# Patient Record
Sex: Female | Born: 1955 | Race: White | Hispanic: No | Smoking: Never smoker
Health system: Southern US, Community
[De-identification: ages and names within clinical notes are randomized; demographics above are authoritative.]

## PROBLEM LIST (undated history)

## (undated) DIAGNOSIS — C50919 Malignant neoplasm of unspecified site of unspecified female breast: Secondary | ICD-10-CM

## (undated) HISTORY — DX: Malignant neoplasm of unspecified site of unspecified female breast: C50.919

## (undated) HISTORY — PX: MASTECTOMY: SHX3

---

## 1996-09-22 HISTORY — PX: BREAST SURGERY: SHX581

## 1998-09-22 HISTORY — PX: BREAST RECONSTRUCTION: SHX9

## 2002-11-16 ENCOUNTER — Encounter: Payer: Self-pay | Admitting: Hematology & Oncology

## 2002-11-16 ENCOUNTER — Encounter: Admission: RE | Admit: 2002-11-16 | Discharge: 2002-11-16 | Payer: Self-pay | Admitting: Hematology & Oncology

## 2003-12-18 ENCOUNTER — Encounter: Admission: RE | Admit: 2003-12-18 | Discharge: 2003-12-18 | Payer: Self-pay | Admitting: Hematology & Oncology

## 2004-02-27 ENCOUNTER — Other Ambulatory Visit: Admission: RE | Admit: 2004-02-27 | Discharge: 2004-02-27 | Payer: Self-pay | Admitting: Family Medicine

## 2004-05-21 ENCOUNTER — Ambulatory Visit (HOSPITAL_COMMUNITY): Admission: RE | Admit: 2004-05-21 | Discharge: 2004-05-21 | Payer: Self-pay | Admitting: Gastroenterology

## 2004-05-21 ENCOUNTER — Encounter (INDEPENDENT_AMBULATORY_CARE_PROVIDER_SITE_OTHER): Payer: Self-pay | Admitting: *Deleted

## 2004-12-12 ENCOUNTER — Ambulatory Visit: Payer: Self-pay | Admitting: Hematology & Oncology

## 2005-01-07 ENCOUNTER — Encounter: Admission: RE | Admit: 2005-01-07 | Discharge: 2005-01-07 | Payer: Self-pay | Admitting: Hematology & Oncology

## 2005-06-12 ENCOUNTER — Ambulatory Visit: Payer: Self-pay | Admitting: Hematology & Oncology

## 2005-12-12 ENCOUNTER — Ambulatory Visit: Payer: Self-pay | Admitting: Hematology & Oncology

## 2006-01-13 ENCOUNTER — Encounter: Admission: RE | Admit: 2006-01-13 | Discharge: 2006-01-13 | Payer: Self-pay | Admitting: Hematology & Oncology

## 2006-02-03 ENCOUNTER — Encounter: Admission: RE | Admit: 2006-02-03 | Discharge: 2006-02-03 | Payer: Self-pay | Admitting: Family Medicine

## 2006-05-06 ENCOUNTER — Other Ambulatory Visit: Admission: RE | Admit: 2006-05-06 | Discharge: 2006-05-06 | Payer: Self-pay | Admitting: Family Medicine

## 2006-06-11 ENCOUNTER — Ambulatory Visit: Payer: Self-pay | Admitting: Hematology & Oncology

## 2006-12-09 ENCOUNTER — Ambulatory Visit: Payer: Self-pay | Admitting: Hematology & Oncology

## 2006-12-14 LAB — COMPREHENSIVE METABOLIC PANEL
ALT: 19 U/L (ref 0–35)
Albumin: 4.6 g/dL (ref 3.5–5.2)
CO2: 28 mEq/L (ref 19–32)
Calcium: 9.7 mg/dL (ref 8.4–10.5)
Chloride: 102 mEq/L (ref 96–112)
Glucose, Bld: 94 mg/dL (ref 70–99)
Sodium: 140 mEq/L (ref 135–145)
Total Bilirubin: 0.5 mg/dL (ref 0.3–1.2)
Total Protein: 7.2 g/dL (ref 6.0–8.3)

## 2006-12-14 LAB — CBC WITH DIFFERENTIAL/PLATELET
BASO%: 0.3 % (ref 0.0–2.0)
Eosinophils Absolute: 0.2 10*3/uL (ref 0.0–0.5)
HCT: 40.3 % (ref 34.8–46.6)
LYMPH%: 12.1 % — ABNORMAL LOW (ref 14.0–48.0)
MCHC: 34.9 g/dL (ref 32.0–36.0)
MONO#: 0.4 10*3/uL (ref 0.1–0.9)
NEUT#: 3.1 10*3/uL (ref 1.5–6.5)
Platelets: 203 10*3/uL (ref 145–400)
RBC: 4.79 10*6/uL (ref 3.70–5.32)
WBC: 4.2 10*3/uL (ref 3.9–10.0)
lymph#: 0.5 10*3/uL — ABNORMAL LOW (ref 0.9–3.3)

## 2007-01-18 ENCOUNTER — Encounter: Admission: RE | Admit: 2007-01-18 | Discharge: 2007-01-18 | Payer: Self-pay | Admitting: Hematology & Oncology

## 2007-06-07 ENCOUNTER — Ambulatory Visit: Payer: Self-pay | Admitting: Hematology & Oncology

## 2007-06-09 ENCOUNTER — Other Ambulatory Visit: Admission: RE | Admit: 2007-06-09 | Discharge: 2007-06-09 | Payer: Self-pay | Admitting: Family Medicine

## 2007-06-09 LAB — CBC WITH DIFFERENTIAL/PLATELET
BASO%: 0.8 % (ref 0.0–2.0)
Eosinophils Absolute: 0.2 10*3/uL (ref 0.0–0.5)
LYMPH%: 25.2 % (ref 14.0–48.0)
MCHC: 35.3 g/dL (ref 32.0–36.0)
MONO#: 0.4 10*3/uL (ref 0.1–0.9)
NEUT#: 2.2 10*3/uL (ref 1.5–6.5)
Platelets: 227 10*3/uL (ref 145–400)
RBC: 4.85 10*6/uL (ref 3.70–5.32)
RDW: 12.6 % (ref 11.3–14.5)
WBC: 3.8 10*3/uL — ABNORMAL LOW (ref 3.9–10.0)
lymph#: 1 10*3/uL (ref 0.9–3.3)

## 2007-06-09 LAB — COMPREHENSIVE METABOLIC PANEL
ALT: 22 U/L (ref 0–35)
Albumin: 4.6 g/dL (ref 3.5–5.2)
CO2: 28 mEq/L (ref 19–32)
Chloride: 105 mEq/L (ref 96–112)
Glucose, Bld: 62 mg/dL — ABNORMAL LOW (ref 70–99)
Potassium: 4.1 mEq/L (ref 3.5–5.3)
Sodium: 142 mEq/L (ref 135–145)
Total Bilirubin: 0.5 mg/dL (ref 0.3–1.2)
Total Protein: 7.2 g/dL (ref 6.0–8.3)

## 2007-12-08 ENCOUNTER — Ambulatory Visit: Payer: Self-pay | Admitting: Hematology & Oncology

## 2007-12-10 LAB — COMPREHENSIVE METABOLIC PANEL
CO2: 28 mEq/L (ref 19–32)
Calcium: 9.8 mg/dL (ref 8.4–10.5)
Chloride: 101 mEq/L (ref 96–112)
Creatinine, Ser: 0.94 mg/dL (ref 0.40–1.20)
Glucose, Bld: 162 mg/dL — ABNORMAL HIGH (ref 70–99)
Total Bilirubin: 0.4 mg/dL (ref 0.3–1.2)

## 2007-12-10 LAB — CBC WITH DIFFERENTIAL/PLATELET
Basophils Absolute: 0.1 10*3/uL (ref 0.0–0.1)
Eosinophils Absolute: 0.1 10*3/uL (ref 0.0–0.5)
HCT: 41.5 % (ref 34.8–46.6)
HGB: 14.3 g/dL (ref 11.6–15.9)
LYMPH%: 13 % — ABNORMAL LOW (ref 14.0–48.0)
MONO#: 0.3 10*3/uL (ref 0.1–0.9)
NEUT#: 5.4 10*3/uL (ref 1.5–6.5)
NEUT%: 79.8 % — ABNORMAL HIGH (ref 39.6–76.8)
Platelets: 276 10*3/uL (ref 145–400)
WBC: 6.8 10*3/uL (ref 3.9–10.0)
lymph#: 0.9 10*3/uL (ref 0.9–3.3)

## 2007-12-31 IMAGING — CR DG KNEE 3 VIEWS*R*
3 series · 3 of 3 positions shown · non-contrast
Comparison: none

CLINICAL DATA: Pain in the knee.  No trauma. 
 RIGHT KNEE THREE VIEWS:
 Three views of the right knee were obtained.  Joint spaces are normal and no effusion is seen.  No bony abnormality is noted.

[view not recorded (1 of 3)]
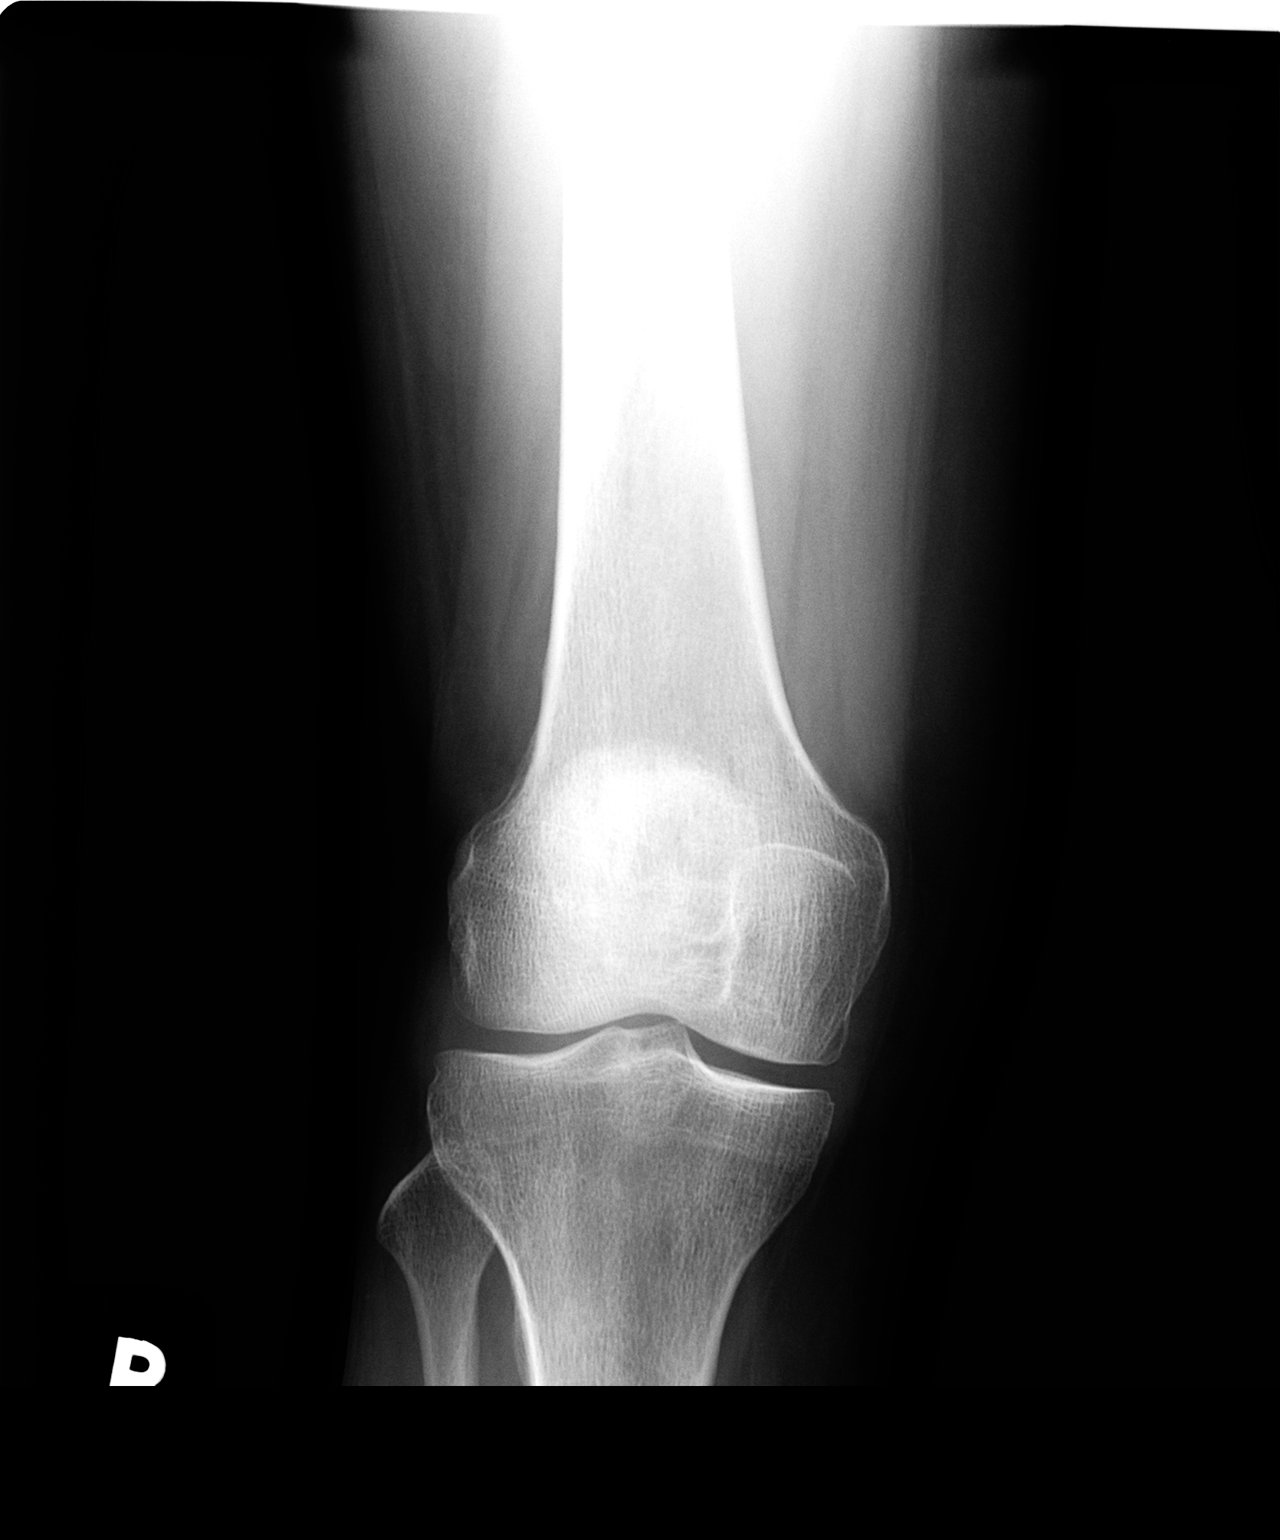

[view not recorded (2 of 3)]
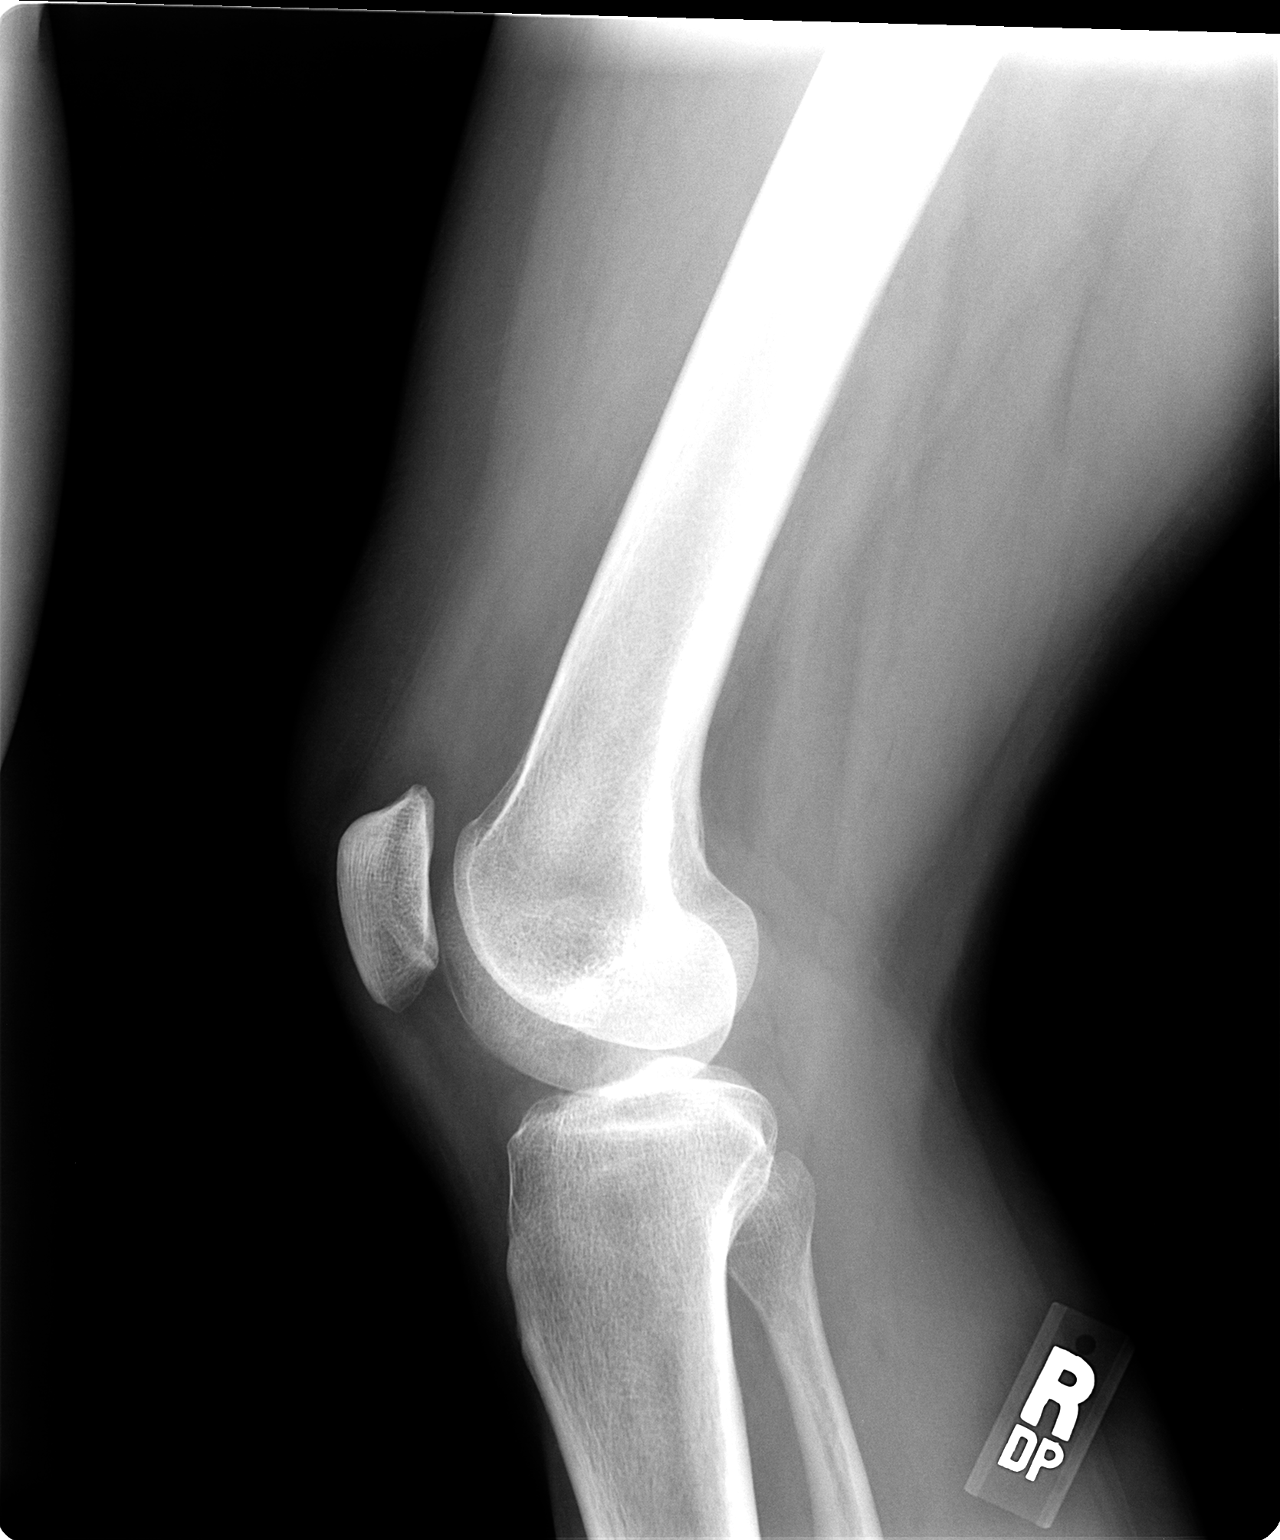

[view not recorded (3 of 3)]
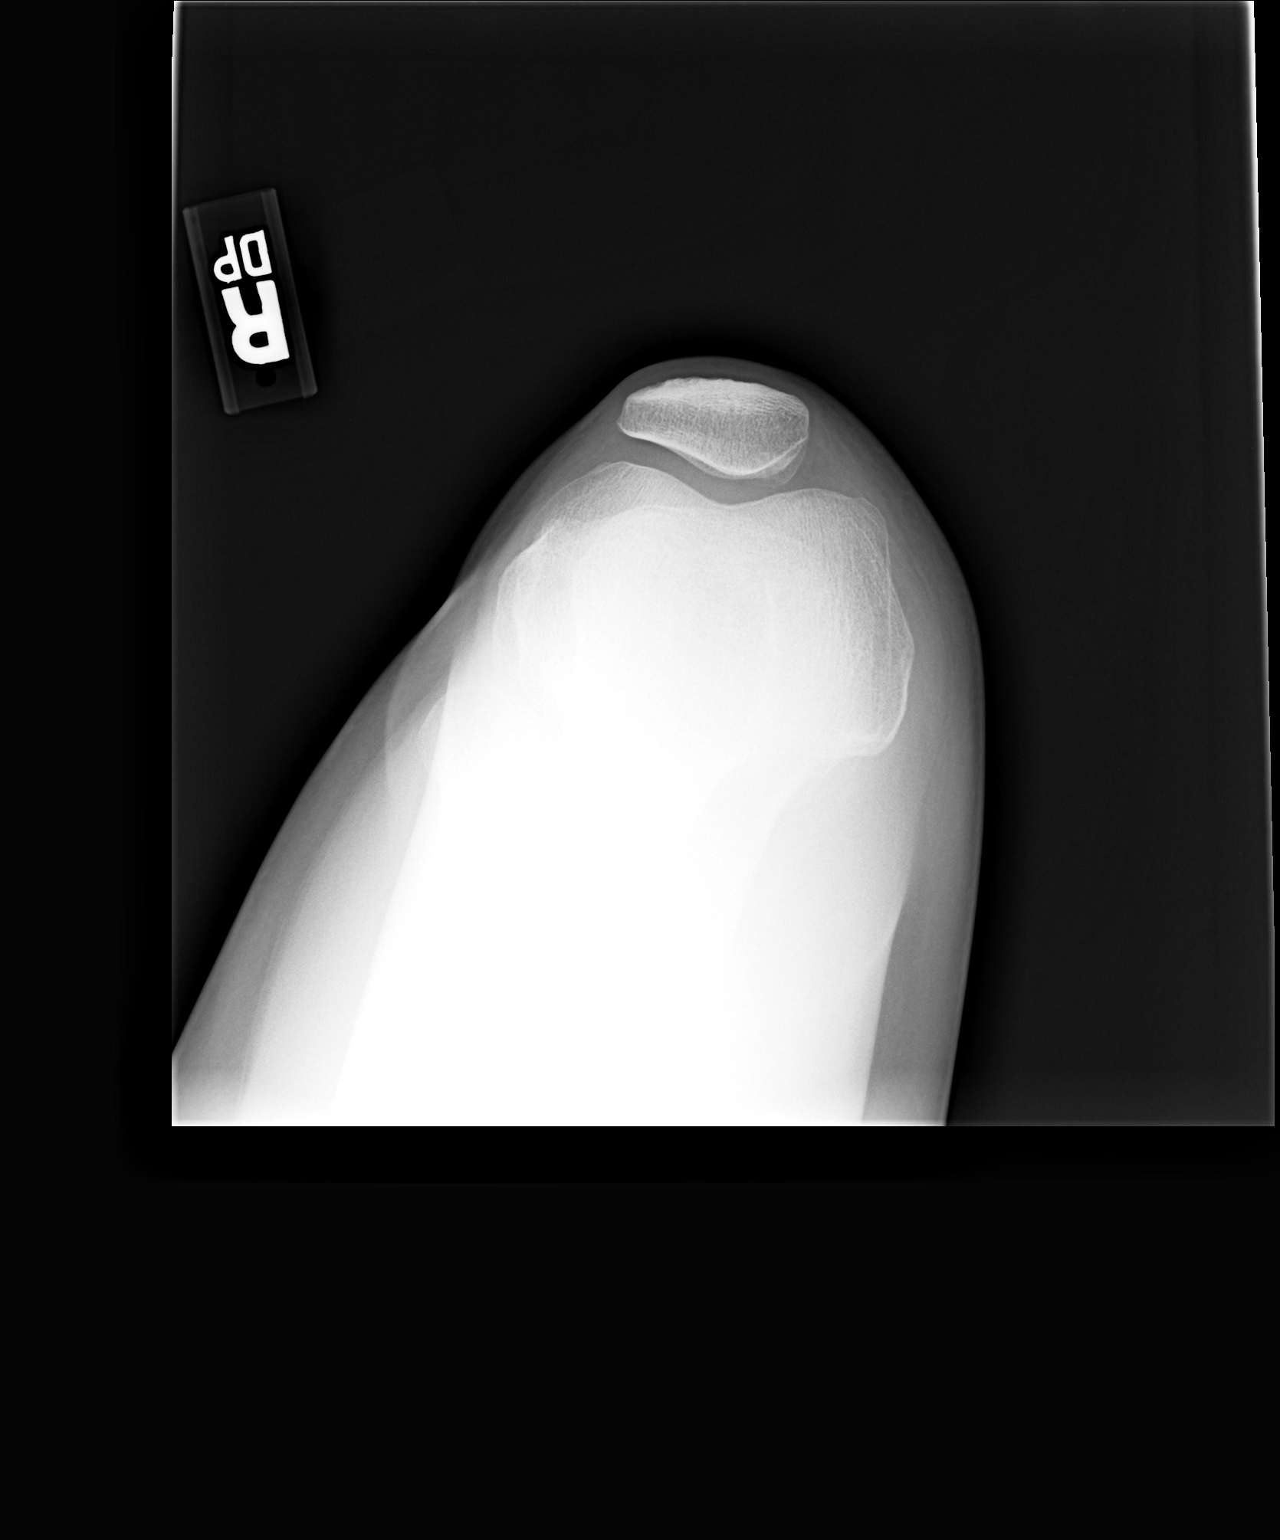

[3 of 3 positions shown; findings below may reference images not displayed]

IMPRESSION: Negative right knee.

## 2008-01-19 ENCOUNTER — Encounter: Admission: RE | Admit: 2008-01-19 | Discharge: 2008-01-19 | Payer: Self-pay | Admitting: Family Medicine

## 2008-01-24 ENCOUNTER — Encounter: Admission: RE | Admit: 2008-01-24 | Discharge: 2008-01-24 | Payer: Self-pay | Admitting: Family Medicine

## 2008-06-08 ENCOUNTER — Ambulatory Visit: Payer: Self-pay | Admitting: Hematology & Oncology

## 2008-06-09 LAB — CBC WITH DIFFERENTIAL (CANCER CENTER ONLY)
BASO#: 0 10*3/uL (ref 0.0–0.2)
BASO%: 0.7 % (ref 0.0–2.0)
Eosinophils Absolute: 0.2 10*3/uL (ref 0.0–0.5)
HCT: 44.3 % (ref 34.8–46.6)
HGB: 15.2 g/dL (ref 11.6–15.9)
LYMPH#: 1.2 10*3/uL (ref 0.9–3.3)
MONO#: 0.3 10*3/uL (ref 0.1–0.9)
NEUT%: 57 % (ref 39.6–80.0)
RBC: 5.3 10*6/uL (ref 3.70–5.32)
WBC: 4 10*3/uL (ref 3.9–10.0)

## 2008-06-09 LAB — COMPREHENSIVE METABOLIC PANEL
ALT: 17 U/L (ref 0–35)
BUN: 14 mg/dL (ref 6–23)
CO2: 27 mEq/L (ref 19–32)
Creatinine, Ser: 0.89 mg/dL (ref 0.40–1.20)
Glucose, Bld: 97 mg/dL (ref 70–99)
Total Bilirubin: 0.5 mg/dL (ref 0.3–1.2)

## 2009-01-04 ENCOUNTER — Ambulatory Visit: Payer: Self-pay | Admitting: Hematology & Oncology

## 2009-01-05 LAB — CBC WITH DIFFERENTIAL (CANCER CENTER ONLY)
BASO%: 0.5 % (ref 0.0–2.0)
Eosinophils Absolute: 0.2 10*3/uL (ref 0.0–0.5)
LYMPH#: 1 10*3/uL (ref 0.9–3.3)
MONO#: 0.2 10*3/uL (ref 0.1–0.9)
Platelets: 217 10*3/uL (ref 145–400)
RBC: 4.57 10*6/uL (ref 3.70–5.32)
RDW: 11.6 % (ref 10.5–14.6)
WBC: 3.6 10*3/uL — ABNORMAL LOW (ref 3.9–10.0)

## 2009-01-08 LAB — COMPREHENSIVE METABOLIC PANEL
ALT: 20 U/L (ref 0–35)
Albumin: 4.6 g/dL (ref 3.5–5.2)
Alkaline Phosphatase: 51 U/L (ref 39–117)
Glucose, Bld: 83 mg/dL (ref 70–99)
Potassium: 4.2 mEq/L (ref 3.5–5.3)
Sodium: 139 mEq/L (ref 135–145)
Total Protein: 7.3 g/dL (ref 6.0–8.3)

## 2009-01-08 LAB — VITAMIN D 25 HYDROXY (VIT D DEFICIENCY, FRACTURES): Vit D, 25-Hydroxy: 33 ng/mL (ref 30–89)

## 2009-01-24 ENCOUNTER — Encounter: Admission: RE | Admit: 2009-01-24 | Discharge: 2009-01-24 | Payer: Self-pay | Admitting: Hematology & Oncology

## 2009-05-22 ENCOUNTER — Other Ambulatory Visit: Admission: RE | Admit: 2009-05-22 | Discharge: 2009-05-22 | Payer: Self-pay | Admitting: Family Medicine

## 2009-07-11 ENCOUNTER — Ambulatory Visit: Payer: Self-pay | Admitting: Hematology & Oncology

## 2009-07-13 LAB — CBC WITH DIFFERENTIAL (CANCER CENTER ONLY)
BASO%: 0.6 % (ref 0.0–2.0)
LYMPH%: 29.1 % (ref 14.0–48.0)
MCV: 87 fL (ref 81–101)
MONO#: 0.3 10*3/uL (ref 0.1–0.9)
MONO%: 7.2 % (ref 0.0–13.0)
NEUT#: 2 10*3/uL (ref 1.5–6.5)
Platelets: 222 10*3/uL (ref 145–400)
RBC: 4.92 10*6/uL (ref 3.70–5.32)
RDW: 12.2 % (ref 10.5–14.6)
WBC: 3.6 10*3/uL — ABNORMAL LOW (ref 3.9–10.0)

## 2009-07-14 LAB — COMPREHENSIVE METABOLIC PANEL
Albumin: 4.8 g/dL (ref 3.5–5.2)
Alkaline Phosphatase: 47 U/L (ref 39–117)
BUN: 12 mg/dL (ref 6–23)
Glucose, Bld: 75 mg/dL (ref 70–99)
Potassium: 4.4 mEq/L (ref 3.5–5.3)
Total Bilirubin: 0.5 mg/dL (ref 0.3–1.2)

## 2010-01-22 ENCOUNTER — Encounter: Admission: RE | Admit: 2010-01-22 | Discharge: 2010-01-22 | Payer: Self-pay | Admitting: Hematology & Oncology

## 2010-07-11 ENCOUNTER — Ambulatory Visit: Payer: Self-pay | Admitting: Hematology & Oncology

## 2010-07-12 LAB — CBC WITH DIFFERENTIAL (CANCER CENTER ONLY)
BASO#: 0 10*3/uL (ref 0.0–0.2)
EOS%: 5 % (ref 0.0–7.0)
HCT: 41.4 % (ref 34.8–46.6)
HGB: 14 g/dL (ref 11.6–15.9)
MCH: 28.8 pg (ref 26.0–34.0)
MCHC: 33.7 g/dL (ref 32.0–36.0)
MONO%: 9.7 % (ref 0.0–13.0)
NEUT%: 60.3 % (ref 39.6–80.0)

## 2010-10-13 ENCOUNTER — Encounter: Payer: Self-pay | Admitting: Family Medicine

## 2011-01-20 ENCOUNTER — Other Ambulatory Visit: Payer: Self-pay | Admitting: Hematology & Oncology

## 2011-01-20 DIAGNOSIS — Z9010 Acquired absence of unspecified breast and nipple: Secondary | ICD-10-CM

## 2011-01-27 ENCOUNTER — Other Ambulatory Visit: Payer: Self-pay | Admitting: Dermatology

## 2011-02-03 ENCOUNTER — Ambulatory Visit
Admission: RE | Admit: 2011-02-03 | Discharge: 2011-02-03 | Disposition: A | Discharge status: Home / Self Care | Payer: BC Managed Care – PPO | Source: Ambulatory Visit | Attending: Hematology & Oncology | Admitting: Hematology & Oncology

## 2011-02-03 DIAGNOSIS — Z9010 Acquired absence of unspecified breast and nipple: Secondary | ICD-10-CM

## 2011-03-11 ENCOUNTER — Other Ambulatory Visit: Payer: Self-pay | Admitting: Family Medicine

## 2011-03-11 ENCOUNTER — Other Ambulatory Visit (HOSPITAL_COMMUNITY)
Admission: RE | Admit: 2011-03-11 | Discharge: 2011-03-11 | Disposition: A | Discharge status: Home / Self Care | Payer: BC Managed Care – PPO | Source: Ambulatory Visit | Attending: Family Medicine | Admitting: Family Medicine

## 2011-03-11 DIAGNOSIS — Z124 Encounter for screening for malignant neoplasm of cervix: Secondary | ICD-10-CM | POA: Insufficient documentation

## 2011-07-04 ENCOUNTER — Other Ambulatory Visit: Payer: Self-pay | Admitting: Hematology & Oncology

## 2011-07-04 ENCOUNTER — Encounter (HOSPITAL_BASED_OUTPATIENT_CLINIC_OR_DEPARTMENT_OTHER): Payer: BC Managed Care – PPO | Admitting: Hematology & Oncology

## 2011-07-04 DIAGNOSIS — C50419 Malignant neoplasm of upper-outer quadrant of unspecified female breast: Secondary | ICD-10-CM

## 2011-07-04 DIAGNOSIS — M949 Disorder of cartilage, unspecified: Secondary | ICD-10-CM

## 2011-07-04 DIAGNOSIS — Z853 Personal history of malignant neoplasm of breast: Secondary | ICD-10-CM

## 2011-07-04 LAB — CBC WITH DIFFERENTIAL (CANCER CENTER ONLY)
BASO#: 0 10*3/uL (ref 0.0–0.2)
EOS%: 5.5 % (ref 0.0–7.0)
HCT: 42.4 % (ref 34.8–46.6)
HGB: 14.5 g/dL (ref 11.6–15.9)
LYMPH#: 1.1 10*3/uL (ref 0.9–3.3)
MCH: 29.4 pg (ref 26.0–34.0)
MCHC: 34.2 g/dL (ref 32.0–36.0)
MCV: 86 fL (ref 81–101)
NEUT%: 56.7 % (ref 39.6–80.0)

## 2011-07-05 LAB — COMPREHENSIVE METABOLIC PANEL
ALT: 31 U/L (ref 0–35)
CO2: 27 mEq/L (ref 19–32)
Creatinine, Ser: 0.82 mg/dL (ref 0.50–1.10)
Total Bilirubin: 0.4 mg/dL (ref 0.3–1.2)

## 2011-07-05 LAB — VITAMIN D 25 HYDROXY (VIT D DEFICIENCY, FRACTURES): Vit D, 25-Hydroxy: 43 ng/mL (ref 30–89)

## 2011-08-04 ENCOUNTER — Encounter: Payer: Self-pay | Admitting: *Deleted

## 2011-08-13 ENCOUNTER — Encounter: Payer: Self-pay | Admitting: Family

## 2011-08-13 ENCOUNTER — Ambulatory Visit (HOSPITAL_BASED_OUTPATIENT_CLINIC_OR_DEPARTMENT_OTHER): Payer: BC Managed Care – PPO | Admitting: Family

## 2011-08-13 VITALS — BP 100/67 | HR 85 | Temp 97.2°F | Ht 61.5 in | Wt 131.0 lb

## 2011-08-13 DIAGNOSIS — C50919 Malignant neoplasm of unspecified site of unspecified female breast: Secondary | ICD-10-CM

## 2011-08-13 DIAGNOSIS — Z31438 Encounter for other genetic testing of female for procreative management: Secondary | ICD-10-CM

## 2011-08-13 DIAGNOSIS — Z853 Personal history of malignant neoplasm of breast: Secondary | ICD-10-CM

## 2011-08-13 HISTORY — DX: Malignant neoplasm of unspecified site of unspecified female breast: C50.919

## 2011-08-13 NOTE — Progress Notes (Signed)
DIAGNOSIS: Patient Active Problem List  Diagnoses Date Noted  . Breast cancer, female 08/13/2011     Encounter Diagnosis  Name Primary?  . Breast cancer, female     CURRENT THERAPY: Surveillance only.   INTERIM HISTORY: Pt expressed interest in genetic testing at last visit with Dr. Myna Hidalgo one month ago. She is here for discussion regarding BRCA testing.   PHYSICAL EXAM: BP 100/67  Pulse 85  Temp(Src) 97.2 F (36.2 C) (Oral)  Ht 5' 1.5" (1.562 m)  Wt 131 lb 0.1 oz (59.423 kg)  BMI 24.35 kg/m2 General: Well developed, well nourished, in no acute distress.  Psych: Alert and oriented X 3, appropriate mood and affect.     LABORATORY STUDIES:   Results for orders placed in visit on 07/04/11  COMPREHENSIVE METABOLIC PANEL      Component Value Range   Sodium 143  135 - 145 (mEq/L)   Potassium 4.9  3.5 - 5.3 (mEq/L)   Chloride 103  96 - 112 (mEq/L)   CO2 27  19 - 32 (mEq/L)   Glucose, Bld 79  70 - 99 (mg/dL)   BUN 16  6 - 23 (mg/dL)   Creatinine, Ser 1.61  0.50 - 1.10 (mg/dL)   Total Bilirubin 0.4  0.3 - 1.2 (mg/dL)   Alkaline Phosphatase 73  39 - 117 (U/L)   AST 30  0 - 37 (U/L)   ALT 31  0 - 35 (U/L)   Total Protein 7.2  6.0 - 8.3 (g/dL)   Albumin 4.8  3.5 - 5.2 (g/dL)   Calcium 09.6  8.4 - 10.5 (mg/dL)  COMPREHENSIVE METABOLIC PANEL      Component Value Range   Sodium 143  135 - 145 (mEq/L)   Potassium 4.9  3.5 - 5.3 (mEq/L)   Chloride 103  96 - 112 (mEq/L)   CO2 27  19 - 32 (mEq/L)   Glucose, Bld 79  70 - 99 (mg/dL)   BUN 16  6 - 23 (mg/dL)   Creatinine, Ser 0.45  0.50 - 1.10 (mg/dL)   Total Bilirubin 0.4  0.3 - 1.2 (mg/dL)   Alkaline Phosphatase 73  39 - 117 (U/L)   AST 30  0 - 37 (U/L)   ALT 31  0 - 35 (U/L)   Total Protein 7.2  6.0 - 8.3 (g/dL)   Albumin 4.8  3.5 - 5.2 (g/dL)   Calcium 40.9  8.4 - 10.5 (mg/dL)  VITAMIN D 25 HYDROXY      Component Value Range   Vit D, 25-Hydroxy 43  30 - 89 (ng/mL)    IMPRESSION:  55 y/o female with history of breast  cancer diagnosed at age 46. Following NCCN guidelines, a personal history of breast cancer diagnosed before age 86, there is an indication for testing.   PLAN:  Return to clinic Monday for testing.   DISCUSSION: The implications of both positive and negative result are discussed with her. These implications are for her, and for her daughters, who could be potential carriers, if she is positive. If she proves to be positive, I will refer her for genetic counseling for broader discussion regarding management.

## 2011-08-26 ENCOUNTER — Telehealth: Payer: Self-pay | Admitting: Family

## 2011-08-27 NOTE — Telephone Encounter (Signed)
Gave results of genetic testing (no mutation detected.)

## 2011-09-01 ENCOUNTER — Telehealth: Payer: Self-pay | Admitting: *Deleted

## 2011-09-01 NOTE — Telephone Encounter (Signed)
Per NP mailed results to patient

## 2011-09-04 ENCOUNTER — Other Ambulatory Visit: Payer: Self-pay | Admitting: Family

## 2012-01-13 ENCOUNTER — Other Ambulatory Visit: Payer: Self-pay | Admitting: Hematology & Oncology

## 2012-01-13 DIAGNOSIS — Z1231 Encounter for screening mammogram for malignant neoplasm of breast: Secondary | ICD-10-CM

## 2012-02-06 ENCOUNTER — Ambulatory Visit
Admission: RE | Admit: 2012-02-06 | Discharge: 2012-02-06 | Disposition: A | Discharge status: Home / Self Care | Payer: BC Managed Care – PPO | Source: Ambulatory Visit | Attending: Hematology & Oncology | Admitting: Hematology & Oncology

## 2012-02-06 DIAGNOSIS — Z1231 Encounter for screening mammogram for malignant neoplasm of breast: Secondary | ICD-10-CM

## 2012-07-02 ENCOUNTER — Ambulatory Visit (HOSPITAL_BASED_OUTPATIENT_CLINIC_OR_DEPARTMENT_OTHER): Payer: BC Managed Care – PPO | Admitting: Hematology & Oncology

## 2012-07-02 ENCOUNTER — Other Ambulatory Visit (HOSPITAL_BASED_OUTPATIENT_CLINIC_OR_DEPARTMENT_OTHER): Payer: BC Managed Care – PPO | Admitting: Lab

## 2012-07-02 VITALS — BP 110/63 | HR 84 | Temp 98.8°F | Resp 16 | Ht 61.0 in | Wt 129.0 lb

## 2012-07-02 DIAGNOSIS — Z853 Personal history of malignant neoplasm of breast: Secondary | ICD-10-CM

## 2012-07-02 DIAGNOSIS — M810 Age-related osteoporosis without current pathological fracture: Secondary | ICD-10-CM

## 2012-07-02 DIAGNOSIS — C50919 Malignant neoplasm of unspecified site of unspecified female breast: Secondary | ICD-10-CM

## 2012-07-02 DIAGNOSIS — C50419 Malignant neoplasm of upper-outer quadrant of unspecified female breast: Secondary | ICD-10-CM

## 2012-07-02 LAB — CBC WITH DIFFERENTIAL (CANCER CENTER ONLY)
BASO%: 0.4 % (ref 0.0–2.0)
HCT: 43.6 % (ref 34.8–46.6)
HGB: 14.8 g/dL (ref 11.6–15.9)
LYMPH#: 1.1 10*3/uL (ref 0.9–3.3)
MONO#: 0.4 10*3/uL (ref 0.1–0.9)
NEUT#: 2.9 10*3/uL (ref 1.5–6.5)
NEUT%: 62.5 % (ref 39.6–80.0)
RDW: 12.7 % (ref 11.1–15.7)
WBC: 4.6 10*3/uL (ref 3.9–10.0)

## 2012-07-02 NOTE — Progress Notes (Signed)
This office note has been dictated.

## 2012-07-02 NOTE — Progress Notes (Signed)
CC:   Debbie Vargas. Debbie Vargas, M.D.  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  CURRENT THERAPY:  Observation.  INTERIM HISTORY:  Patient comes in for followup.  We see her yearly. She did have BRCA testing done last year.  This was done in November. She was negative for both BRCA1 and BRCA2 mutations.  Her Femara was stopped back in 2010.  She was on it for a good 11 years. She is feeling well.  She is still teaching.  This is becoming more of a strain for her.  She wants to retire in 2016.  Her youngest daughter got married over the summer.  This was a good event for her.  She has had no bony pain.  She is not taking vitamin D.  She is not taking aspirin.  I told him to start taking both.  She will do both.  There has been no change in bowel or bladder habits now.  Her last mammogram was done back in May.  This was of the right breast. This was negative for any obvious malignancy or suspicious skin microcalcifications.  PHYSICAL EXAMINATION:  This is a petite white female in no obvious distress.  Vital signs:  98.3, pulse 84, respiratory rate 16, blood pressure 110/63.  Weight is 129.  Head/neck:  Normocephalic, atraumatic skull.  There are no ocular or oral lesions.  There are no palpable cervical or supraclavicular lymph nodes.  Lungs:  Clear bilaterally. Cardiac:  Regular rate and rhythm with a normal S1 and S2.  There are no murmurs, rubs or bruits.  Abdomen:  Soft with good bowel sounds.  There is no palpable abdominal mass.  There is no fluid wave.  There is no palpable hepatosplenomegaly.  Breasts:  Right breast with no masses, edema or erythema.  There is no right axillary adenopathy.  Left chest wall shows a well-healed mastectomy.  In the left chest wall, no nodules are noted.  She has a reconstruction that is well healed.  There is no left axillary adenopathy.  Back:  No tenderness of the spine, ribs, or hips.  Extremities:  No  clubbing, cyanosis or edema.  LABORATORY STUDIES:  White blood cell count 4.6, hemoglobin 14.8, hematocrit 43.6, platelet count 245.  IMPRESSION:  Patient is a 56 year old white female with a history of locally recurrent ductal carcinoma of the left breast.  She underwent mastectomy.  She was diagnosed back in 1999.  She is on Femara.  Again, she is BRCA negative.  We will plan to get her back in another year.  I do not see that we need to do any lab work or x-rays in between visits.  Again, I encouraged her to take the aspirin and the vitamin D.    ______________________________ Josph Macho, M.D. PRE/MEDQ  D:  07/02/2012  T:  07/02/2012  Job:  [id number]

## 2012-07-03 LAB — COMPREHENSIVE METABOLIC PANEL
ALT: 25 U/L (ref 0–35)
AST: 28 U/L (ref 0–37)
Albumin: 4.7 g/dL (ref 3.5–5.2)
BUN: 11 mg/dL (ref 6–23)
Calcium: 10.8 mg/dL — ABNORMAL HIGH (ref 8.4–10.5)
Chloride: 100 mEq/L (ref 96–112)
Potassium: 4.9 mEq/L (ref 3.5–5.3)
Sodium: 139 mEq/L (ref 135–145)
Total Protein: 7.3 g/dL (ref 6.0–8.3)

## 2012-07-07 ENCOUNTER — Telehealth: Payer: Self-pay | Admitting: *Deleted

## 2012-07-07 NOTE — Telephone Encounter (Signed)
Called patient to let her know that her labwork looks fantastic per dr. Myna Hidalgo.

## 2012-07-07 NOTE — Telephone Encounter (Signed)
Message copied by Anselm Jungling on Wed Jul 07, 2012 10:23 AM ------      Message from: Josph Macho      Created: Tue Jul 06, 2012  2:00 PM       Please call and tell her that her lab work looks fantastic. Thank you. Cindee Lame

## 2013-01-11 ENCOUNTER — Other Ambulatory Visit: Payer: Self-pay

## 2013-01-11 DIAGNOSIS — Z9012 Acquired absence of left breast and nipple: Secondary | ICD-10-CM

## 2013-01-11 DIAGNOSIS — Z1231 Encounter for screening mammogram for malignant neoplasm of breast: Secondary | ICD-10-CM

## 2013-02-10 ENCOUNTER — Ambulatory Visit
Admission: RE | Admit: 2013-02-10 | Discharge: 2013-02-10 | Disposition: A | Discharge status: Home / Self Care | Payer: BC Managed Care – PPO | Source: Ambulatory Visit

## 2013-02-10 DIAGNOSIS — Z1231 Encounter for screening mammogram for malignant neoplasm of breast: Secondary | ICD-10-CM

## 2013-02-10 DIAGNOSIS — Z9012 Acquired absence of left breast and nipple: Secondary | ICD-10-CM

## 2013-07-01 ENCOUNTER — Other Ambulatory Visit (HOSPITAL_BASED_OUTPATIENT_CLINIC_OR_DEPARTMENT_OTHER): Payer: BC Managed Care – PPO | Admitting: Lab

## 2013-07-01 ENCOUNTER — Ambulatory Visit (HOSPITAL_BASED_OUTPATIENT_CLINIC_OR_DEPARTMENT_OTHER): Payer: BC Managed Care – PPO | Admitting: Hematology & Oncology

## 2013-07-01 VITALS — BP 116/54 | HR 73 | Temp 98.1°F | Resp 14 | Ht 61.0 in | Wt 131.0 lb

## 2013-07-01 DIAGNOSIS — M810 Age-related osteoporosis without current pathological fracture: Secondary | ICD-10-CM

## 2013-07-01 DIAGNOSIS — C50919 Malignant neoplasm of unspecified site of unspecified female breast: Secondary | ICD-10-CM

## 2013-07-01 DIAGNOSIS — Z853 Personal history of malignant neoplasm of breast: Secondary | ICD-10-CM

## 2013-07-01 DIAGNOSIS — C50912 Malignant neoplasm of unspecified site of left female breast: Secondary | ICD-10-CM

## 2013-07-01 LAB — CBC WITH DIFFERENTIAL (CANCER CENTER ONLY)
BASO%: 0.7 % (ref 0.0–2.0)
HCT: 44 % (ref 34.8–46.6)
LYMPH%: 27.8 % (ref 14.0–48.0)
MCH: 28.6 pg (ref 26.0–34.0)
MCHC: 32.7 g/dL (ref 32.0–36.0)
MCV: 88 fL (ref 81–101)
MONO%: 9.6 % (ref 0.0–13.0)
NEUT%: 57.5 % (ref 39.6–80.0)
Platelets: 226 10*3/uL (ref 145–400)
RDW: 12.5 % (ref 11.1–15.7)
WBC: 4.1 10*3/uL (ref 3.9–10.0)

## 2013-07-01 NOTE — Progress Notes (Signed)
This office note has been dictated.

## 2013-07-02 LAB — VITAMIN D 25 HYDROXY (VIT D DEFICIENCY, FRACTURES): Vit D, 25-Hydroxy: 57 ng/mL (ref 30–89)

## 2013-07-02 LAB — COMPREHENSIVE METABOLIC PANEL
ALT: 22 U/L (ref 0–35)
Alkaline Phosphatase: 62 U/L (ref 39–117)
Creatinine, Ser: 0.8 mg/dL (ref 0.50–1.10)
Sodium: 138 mEq/L (ref 135–145)
Total Bilirubin: 0.5 mg/dL (ref 0.3–1.2)
Total Protein: 6.9 g/dL (ref 6.0–8.3)

## 2013-07-02 NOTE — Progress Notes (Signed)
CC:   Gretta Arab. Valentina Lucks, M.D.  DIAGNOSIS:  History of locally-recurrent ductal carcinoma of the left breast.  CURRENT THERAPY:  Observation.  INTERIM HISTORY:  Ms. Debbie Vargas comes in for her yearly followup.  She is doing really well.  She was will retire in a couple of years.  She is going to [place] in 1 month.  She will be on a cruise with one of her friends.  She is looking forward to this.  She has had no problems with nausea or vomiting.  There has been no bony pain.  She has had no change in bowel or bladder habits.  Hot flashes have not been a problem for her.  She continues to take her baby aspirin.  She also is taking her vitamin D.  Her last mammogram was done back in May of this year.  This was of the right breast.  Everything looked fine without any suspicious calcifications.  PHYSICAL EXAMINATION:  General:  This is a well-developed, well- nourished white female in no obvious distress.  Vital signs: Temperature of 98.1, pulse 73, respiratory rate 14, blood pressure 116/54.  Weight is 131 pounds.  Head and neck:  Normocephalic, atraumatic skull.  There are no ocular or oral lesions.  There are no palpable cervical or supraclavicular lymph nodes.  Lungs:  Clear bilaterally.  Cardiac:  Regular rate and rhythm with a normal S1 and S2. There are no murmurs, rubs or bruits.  Abdomen:  Soft.  She has good bowel sounds.  There is no fluid wave.  There is no palpable abdominal mass.  No palpable hepatosplenomegaly.  Breasts:  Right breast with no masses, edema or erythema.  There is no right axillary adenopathy.  Left breast shows a mastectomy.  She has an implant.  This is intact.  There is no erythema or nodularity about the implant.  There is no left axillary adenopathy.  Back:  No tenderness over the spine, ribs, or hips.  Extremities:  No clubbing, cyanosis or edema.  Neurologic:  No focal neurological deficits.  LABORATORY STUDIES:  White cell count is 4.1, hemoglobin  14.4, hematocrit 44, platelet count 226.  IMPRESSION:  Debbie Vargas is a very charming 57 year old white female with history of locally-recurrent ductal carcinoma of the left breast.  She had this back in 1999.  She was placed on Femara.  She had a mastectomy.  Of note, she is BRCA negative.  For now, we will plan to get her back in 1 more year.  I do not see a need for any lab work or x-rays in between visits.    ______________________________ Josph Macho, M.D. PRE/MEDQ  D:  07/01/2013  T:  07/02/2013  Job:  [id number]

## 2013-07-04 ENCOUNTER — Telehealth: Payer: Self-pay | Admitting: *Deleted

## 2013-07-04 NOTE — Telephone Encounter (Addendum)
Message copied by Mirian Capuchin on Mon Jul 04, 2013 12:51 PM ------      Message from: Josph Macho      Created: Mon Jul 04, 2013 11:31 AM       Call - labs and Vit D are excellent!! Have a great trip!!  Pete ------This message left on pt's home answering machine.

## 2014-01-16 ENCOUNTER — Other Ambulatory Visit: Payer: Self-pay

## 2014-01-16 DIAGNOSIS — Z9012 Acquired absence of left breast and nipple: Secondary | ICD-10-CM

## 2014-01-16 DIAGNOSIS — Z1231 Encounter for screening mammogram for malignant neoplasm of breast: Secondary | ICD-10-CM

## 2014-02-15 ENCOUNTER — Encounter (INDEPENDENT_AMBULATORY_CARE_PROVIDER_SITE_OTHER): Payer: Self-pay

## 2014-02-15 ENCOUNTER — Ambulatory Visit
Admission: RE | Admit: 2014-02-15 | Discharge: 2014-02-15 | Disposition: A | Discharge status: Home / Self Care | Payer: BC Managed Care – PPO | Source: Ambulatory Visit

## 2014-02-15 DIAGNOSIS — Z1231 Encounter for screening mammogram for malignant neoplasm of breast: Secondary | ICD-10-CM

## 2014-02-15 DIAGNOSIS — Z9012 Acquired absence of left breast and nipple: Secondary | ICD-10-CM

## 2014-06-28 ENCOUNTER — Other Ambulatory Visit (HOSPITAL_COMMUNITY)
Admission: RE | Admit: 2014-06-28 | Discharge: 2014-06-28 | Disposition: A | Discharge status: Home / Self Care | Payer: BC Managed Care – PPO | Source: Ambulatory Visit | Attending: Family Medicine | Admitting: Family Medicine

## 2014-06-28 ENCOUNTER — Other Ambulatory Visit: Payer: Self-pay | Admitting: Family Medicine

## 2014-06-28 DIAGNOSIS — Z124 Encounter for screening for malignant neoplasm of cervix: Secondary | ICD-10-CM | POA: Diagnosis not present

## 2014-06-30 ENCOUNTER — Ambulatory Visit (HOSPITAL_BASED_OUTPATIENT_CLINIC_OR_DEPARTMENT_OTHER): Payer: BC Managed Care – PPO | Admitting: Hematology & Oncology

## 2014-06-30 ENCOUNTER — Other Ambulatory Visit (HOSPITAL_BASED_OUTPATIENT_CLINIC_OR_DEPARTMENT_OTHER): Payer: BC Managed Care – PPO | Admitting: Lab

## 2014-06-30 ENCOUNTER — Encounter: Payer: Self-pay | Admitting: Hematology & Oncology

## 2014-06-30 VITALS — BP 123/58 | HR 78 | Temp 98.1°F | Resp 14 | Ht 61.0 in | Wt 137.0 lb

## 2014-06-30 DIAGNOSIS — Z853 Personal history of malignant neoplasm of breast: Secondary | ICD-10-CM

## 2014-06-30 DIAGNOSIS — C50912 Malignant neoplasm of unspecified site of left female breast: Secondary | ICD-10-CM

## 2014-06-30 DIAGNOSIS — M810 Age-related osteoporosis without current pathological fracture: Secondary | ICD-10-CM

## 2014-06-30 LAB — CBC WITH DIFFERENTIAL (CANCER CENTER ONLY)
BASO#: 0 10*3/uL (ref 0.0–0.2)
BASO%: 0.7 % (ref 0.0–2.0)
EOS ABS: 0.2 10*3/uL (ref 0.0–0.5)
EOS%: 4.8 % (ref 0.0–7.0)
HCT: 42.2 % (ref 34.8–46.6)
HGB: 14.1 g/dL (ref 11.6–15.9)
LYMPH#: 1 10*3/uL (ref 0.9–3.3)
LYMPH%: 22.1 % (ref 14.0–48.0)
MCH: 29.4 pg (ref 26.0–34.0)
MCHC: 33.4 g/dL (ref 32.0–36.0)
MCV: 88 fL (ref 81–101)
MONO#: 0.4 10*3/uL (ref 0.1–0.9)
MONO%: 9.7 % (ref 0.0–13.0)
NEUT%: 62.7 % (ref 39.6–80.0)
NEUTROS ABS: 2.7 10*3/uL (ref 1.5–6.5)
PLATELETS: 246 10*3/uL (ref 145–400)
RBC: 4.79 10*6/uL (ref 3.70–5.32)
RDW: 12.8 % (ref 11.1–15.7)
WBC: 4.4 10*3/uL (ref 3.9–10.0)

## 2014-06-30 LAB — CMP (CANCER CENTER ONLY)
ALK PHOS: 67 U/L (ref 26–84)
ALT: 30 U/L (ref 10–47)
AST: 25 U/L (ref 11–38)
Albumin: 3.7 g/dL (ref 3.3–5.5)
BILIRUBIN TOTAL: 0.6 mg/dL (ref 0.20–1.60)
BUN, Bld: 10 mg/dL (ref 7–22)
CO2: 27 meq/L (ref 18–33)
Calcium: 9.2 mg/dL (ref 8.0–10.3)
Chloride: 110 mEq/L — ABNORMAL HIGH (ref 98–108)
Creat: 0.5 mg/dl — ABNORMAL LOW (ref 0.6–1.2)
Glucose, Bld: 95 mg/dL (ref 73–118)
Potassium: 4.1 mEq/L (ref 3.3–4.7)
SODIUM: 142 meq/L (ref 128–145)
TOTAL PROTEIN: 6.9 g/dL (ref 6.4–8.1)

## 2014-06-30 NOTE — Progress Notes (Signed)
Hematology and Oncology Follow Up Visit  Debbie Vargas [id number] 01-25-1956 58 y.o. 06/30/2014   Principle Diagnosis:  History of locally-recurrent ductal carcinoma of the left breast.  Current Therapy:    Observation     Interim History:  Debbie Vargas is back for followup. We see her yearly. She is on currently well. 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  She's had no problems since I saw her. She actually went to [place] for a cruise. She had a good time doing that.  She's had no problems with cough. There is no abdominal pain. There's been no problems with bowels or bladder. She's had no leg swelling. She's had no rashes. She's still taking her vitamin D.  Last mammogram was a year ago and always looked pretty good. Medications: Current outpatient prescriptions:aspirin 81 MG tablet, Take 81 mg by mouth daily.  , Disp: , Rfl: ;  Biotin 10 MG TABS, Take by mouth every morning., Disp: , Rfl: ;  Calcium-Vitamin D (CALTRATE 600 PLUS-VIT D PO), Take 1,500 mg by mouth daily. , Disp: , Rfl: ;  Multiple Vitamin (MULTIVITAMIN) capsule, Take 1 capsule by mouth daily.  , Disp: , Rfl:   Allergies: No Known Allergies  Past Medical History, Surgical history, Social history, and Family History were reviewed and updated.  Review of Systems: As above  Physical Exam:  height is 5' 1"  (1.549 m) and weight is 137 lb (62.143 kg). Her oral temperature is 98.1 F (36.7 C). Her blood pressure is 123/58 and her pulse is 78. Her respiration is 14.   Well-developed and well-nourished white female. Head and neck exam shows no ocular or oral lesions. There are no palpable cervical or supraclavicular lymph nodes. Lungs are clear. Cardiac exam regular in rhythm with no murmurs, rubs or bruits. Breast exam shows progress with some breast reduction scars. No masses are noted in the right breast. Left breast is reconstructed. She had a mastectomy. She is she has no masses in the left  breast. There is no left axillary adenopathy. Abdomen is soft. Has good bowel sounds. There is no fluid wave is no palpable liver or spleen. Extremities shows no clubbing, cyanosis or edema. Neurological exam shows no focal neurological deficits. Skin exam no rashes, ecchymosis or petechia.  Lab Results  Component Value Date   WBC 4.4 06/30/2014   HGB 14.1 06/30/2014   HCT 42.2 06/30/2014   MCV 88 06/30/2014   PLT 246 06/30/2014     Chemistry      Component Value Date/Time   NA 142 06/30/2014 0848   NA 138 07/01/2013 0830   K 4.1 06/30/2014 0848   K 4.5 07/01/2013 0830   CL 110* 06/30/2014 0848   CL 102 07/01/2013 0830   CO2 27 06/30/2014 0848   CO2 31 07/01/2013 0830   BUN 10 06/30/2014 0848   BUN 11 07/01/2013 0830   CREATININE 0.5* 06/30/2014 0848   CREATININE 0.80 07/01/2013 0830      Component Value Date/Time   CALCIUM 9.2 06/30/2014 0848   CALCIUM 10.0 07/01/2013 0830   ALKPHOS 67 06/30/2014 0848   ALKPHOS 62 07/01/2013 0830   AST 25 06/30/2014 0848   AST 25 07/01/2013 0830   ALT 30 06/30/2014 0848   ALT 22 07/01/2013 0830   BILITOT 0.60 06/30/2014 0848   BILITOT 0.5 07/01/2013 0830         Impression and Plan: Debbie Vargas is 58 year old white female. Is  down about 16 years since her diagnosis. She had a mastectomy. She resection. She is BRCA negative. She was on Femara.  Again there is no evidence of recurrence.  We will go ahead and plan for another followup in one year.   Volanda Napoleon, MD 10/9/201511:16 AM

## 2014-07-01 LAB — VITAMIN D 25 HYDROXY (VIT D DEFICIENCY, FRACTURES): VIT D 25 HYDROXY: 52 ng/mL (ref 30–89)

## 2014-07-03 ENCOUNTER — Telehealth: Payer: Self-pay

## 2014-07-03 NOTE — Telephone Encounter (Addendum)
Message copied by Johny Drilling on Mon Jul 03, 2014  3:33 PM ------      Message from: Volanda Napoleon      Created: Sun Jul 02, 2014  1:58 PM       Call - vit D is ok!! pete ------  This information left on personalized VM with instructions to contact our office for questions or concerns.

## 2014-07-05 LAB — CYTOLOGY - PAP

## 2014-11-16 ENCOUNTER — Other Ambulatory Visit: Payer: Self-pay | Admitting: Gastroenterology

## 2015-01-31 ENCOUNTER — Other Ambulatory Visit: Payer: Self-pay

## 2015-01-31 DIAGNOSIS — Z1231 Encounter for screening mammogram for malignant neoplasm of breast: Secondary | ICD-10-CM

## 2015-02-22 ENCOUNTER — Ambulatory Visit
Admission: RE | Admit: 2015-02-22 | Discharge: 2015-02-22 | Disposition: A | Discharge status: Home / Self Care | Payer: BC Managed Care – PPO | Source: Ambulatory Visit

## 2015-02-22 DIAGNOSIS — Z1231 Encounter for screening mammogram for malignant neoplasm of breast: Secondary | ICD-10-CM

## 2015-04-13 ENCOUNTER — Encounter (HOSPITAL_BASED_OUTPATIENT_CLINIC_OR_DEPARTMENT_OTHER): Payer: Self-pay | Admitting: *Deleted

## 2015-04-18 ENCOUNTER — Ambulatory Visit (HOSPITAL_BASED_OUTPATIENT_CLINIC_OR_DEPARTMENT_OTHER): Payer: BC Managed Care – PPO | Admitting: Certified Registered"

## 2015-04-18 ENCOUNTER — Encounter (HOSPITAL_BASED_OUTPATIENT_CLINIC_OR_DEPARTMENT_OTHER)
Admission: RE | Disposition: A | Discharge status: Home / Self Care | Payer: Self-pay | Source: Ambulatory Visit | Attending: Plastic Surgery

## 2015-04-18 ENCOUNTER — Encounter (HOSPITAL_BASED_OUTPATIENT_CLINIC_OR_DEPARTMENT_OTHER): Payer: Self-pay | Admitting: *Deleted

## 2015-04-18 ENCOUNTER — Ambulatory Visit (HOSPITAL_BASED_OUTPATIENT_CLINIC_OR_DEPARTMENT_OTHER)
Admission: RE | Admit: 2015-04-18 | Discharge: 2015-04-18 | Disposition: A | Discharge status: Home / Self Care | Payer: BC Managed Care – PPO | Source: Ambulatory Visit | Attending: Plastic Surgery | Admitting: Plastic Surgery

## 2015-04-18 DIAGNOSIS — Z853 Personal history of malignant neoplasm of breast: Secondary | ICD-10-CM | POA: Insufficient documentation

## 2015-04-18 DIAGNOSIS — Z9012 Acquired absence of left breast and nipple: Secondary | ICD-10-CM | POA: Diagnosis not present

## 2015-04-18 DIAGNOSIS — N650 Deformity of reconstructed breast: Secondary | ICD-10-CM | POA: Diagnosis not present

## 2015-04-18 DIAGNOSIS — T8549XA Other mechanical complication of breast prosthesis and implant, initial encounter: Secondary | ICD-10-CM | POA: Insufficient documentation

## 2015-04-18 DIAGNOSIS — Y812 Prosthetic and other implants, materials and accessory general- and plastic-surgery devices associated with adverse incidents: Secondary | ICD-10-CM | POA: Insufficient documentation

## 2015-04-18 HISTORY — PX: REMOVAL OF TISSUE EXPANDER AND PLACEMENT OF IMPLANT: SHX6457

## 2015-04-18 SURGERY — REMOVAL, TISSUE EXPANDER, BREAST, WITH IMPLANT INSERTION
Anesthesia: General | Site: Breast | Laterality: Left

## 2015-04-18 MED ORDER — SUFENTANIL CITRATE 50 MCG/ML IV SOLN
INTRAVENOUS | Status: AC
  Filled 2015-04-18: qty 1

## 2015-04-18 MED ORDER — BUPIVACAINE-EPINEPHRINE (PF) 0.25% -1:200000 IJ SOLN
INTRAMUSCULAR | Status: AC
  Filled 2015-04-18: qty 30

## 2015-04-18 MED ORDER — LIDOCAINE-EPINEPHRINE 1 %-1:100000 IJ SOLN
INTRAMUSCULAR | Status: AC
  Filled 2015-04-18: qty 1

## 2015-04-18 MED ORDER — OXYCODONE HCL 5 MG PO TABS
ORAL_TABLET | ORAL | Status: AC
  Filled 2015-04-18: qty 1

## 2015-04-18 MED ORDER — GLYCOPYRROLATE 0.2 MG/ML IJ SOLN: 0.2000 mg | Freq: Once | INTRAMUSCULAR | Status: DC | PRN

## 2015-04-18 MED ORDER — LIDOCAINE-EPINEPHRINE 1 %-1:100000 IJ SOLN
INTRAMUSCULAR | Status: DC | PRN
  Administered 2015-04-18: 20 mL

## 2015-04-18 MED ORDER — MIDAZOLAM HCL 2 MG/2ML IJ SOLN
INTRAMUSCULAR | Status: AC
  Filled 2015-04-18: qty 2

## 2015-04-18 MED ORDER — BUPIVACAINE-EPINEPHRINE (PF) 0.5% -1:200000 IJ SOLN
INTRAMUSCULAR | Status: AC
  Filled 2015-04-18: qty 30

## 2015-04-18 MED ORDER — LIDOCAINE HCL (CARDIAC) 20 MG/ML IV SOLN
INTRAVENOUS | Status: DC | PRN
  Administered 2015-04-18: 80 mg via INTRAVENOUS

## 2015-04-18 MED ORDER — HYDROMORPHONE HCL 1 MG/ML IJ SOLN: 0.2500 mg | INTRAMUSCULAR | Status: DC | PRN

## 2015-04-18 MED ORDER — BUPIVACAINE LIPOSOME 1.3 % IJ SUSP
INTRAMUSCULAR | Status: DC | PRN
  Administered 2015-04-18: 20 mL

## 2015-04-18 MED ORDER — FENTANYL CITRATE (PF) 100 MCG/2ML IJ SOLN
INTRAMUSCULAR | Status: AC
  Filled 2015-04-18: qty 8

## 2015-04-18 MED ORDER — EPINEPHRINE HCL 1 MG/ML IJ SOLN
INTRAMUSCULAR | Status: AC
  Filled 2015-04-18: qty 1

## 2015-04-18 MED ORDER — FENTANYL CITRATE (PF) 100 MCG/2ML IJ SOLN
50.0000 ug | INTRAMUSCULAR | Status: DC | PRN
  Administered 2015-04-18: 100 ug via INTRAVENOUS

## 2015-04-18 MED ORDER — OXYCODONE HCL 5 MG PO TABS
5.0000 mg | ORAL_TABLET | Freq: Once | ORAL | Status: AC | PRN
  Administered 2015-04-18: 5 mg via ORAL

## 2015-04-18 MED ORDER — POVIDONE-IODINE 10 % EX SOLN
CUTANEOUS | Status: DC | PRN
  Administered 2015-04-18: 1 via TOPICAL

## 2015-04-18 MED ORDER — CEFAZOLIN SODIUM-DEXTROSE 2-3 GM-% IV SOLR
INTRAVENOUS | Status: AC
  Filled 2015-04-18: qty 50

## 2015-04-18 MED ORDER — BUPIVACAINE LIPOSOME 1.3 % IJ SUSP
INTRAMUSCULAR | Status: AC
  Filled 2015-04-18: qty 20

## 2015-04-18 MED ORDER — EPHEDRINE SULFATE 50 MG/ML IJ SOLN
INTRAMUSCULAR | Status: DC | PRN
  Administered 2015-04-18: 10 mg via INTRAVENOUS

## 2015-04-18 MED ORDER — SCOPOLAMINE 1 MG/3DAYS TD PT72: 1.0000 | MEDICATED_PATCH | Freq: Once | TRANSDERMAL | Status: DC | PRN

## 2015-04-18 MED ORDER — LIDOCAINE HCL (PF) 1 % IJ SOLN
INTRAMUSCULAR | Status: AC
  Filled 2015-04-18: qty 30

## 2015-04-18 MED ORDER — PROPOFOL 10 MG/ML IV BOLUS
INTRAVENOUS | Status: DC | PRN
  Administered 2015-04-18: 200 mg via INTRAVENOUS

## 2015-04-18 MED ORDER — CEFAZOLIN SODIUM 1-5 GM-% IV SOLN
1.0000 g | Freq: Once | INTRAVENOUS | Status: AC
  Administered 2015-04-18: 1 g via INTRAVENOUS

## 2015-04-18 MED ORDER — OXYCODONE HCL 5 MG/5ML PO SOLN: 5.0000 mg | Freq: Once | ORAL | Status: AC | PRN

## 2015-04-18 MED ORDER — SODIUM CHLORIDE 0.9 % IR SOLN
Status: DC | PRN
  Administered 2015-04-18: 14:00:00

## 2015-04-18 MED ORDER — MEPERIDINE HCL 25 MG/ML IJ SOLN: 6.2500 mg | INTRAMUSCULAR | Status: DC | PRN

## 2015-04-18 MED ORDER — MIDAZOLAM HCL 2 MG/2ML IJ SOLN
1.0000 mg | INTRAMUSCULAR | Status: DC | PRN
  Administered 2015-04-18: 2 mg via INTRAVENOUS

## 2015-04-18 MED ORDER — DEXAMETHASONE SODIUM PHOSPHATE 4 MG/ML IJ SOLN
INTRAMUSCULAR | Status: DC | PRN
  Administered 2015-04-18: 10 mg via INTRAVENOUS

## 2015-04-18 MED ORDER — LACTATED RINGERS IV SOLN
INTRAVENOUS | Status: DC
  Administered 2015-04-18: 12:00:00 via INTRAVENOUS

## 2015-04-18 MED ORDER — SUCCINYLCHOLINE CHLORIDE 20 MG/ML IJ SOLN
INTRAMUSCULAR | Status: DC | PRN
  Administered 2015-04-18: 65 mg via INTRAVENOUS

## 2015-04-18 SURGICAL SUPPLY — 77 items
APL SKNCLS STERI-STRIP NONHPOA (GAUZE/BANDAGES/DRESSINGS) ×1
BAG DECANTER FOR FLEXI CONT (MISCELLANEOUS) ×3 IMPLANT
BENZOIN TINCTURE PRP APPL 2/3 (GAUZE/BANDAGES/DRESSINGS) ×4 IMPLANT
BLADE HEX COATED 2.75 (ELECTRODE) ×3 IMPLANT
BLADE SURG 10 STRL SS (BLADE) IMPLANT
BLADE SURG 15 STRL LF DISP TIS (BLADE) ×1 IMPLANT
BLADE SURG 15 STRL SS (BLADE) ×3
BNDG GAUZE ELAST 4 BULKY (GAUZE/BANDAGES/DRESSINGS) ×6 IMPLANT
CANISTER LIPO FAT HARVEST (MISCELLANEOUS) IMPLANT
CANISTER SUCT 1200ML W/VALVE (MISCELLANEOUS) ×3 IMPLANT
CLOSURE STERI-STRIP 1/2X4 (GAUZE/BANDAGES/DRESSINGS) ×1
CLOSURE WOUND 1/2 X4 (GAUZE/BANDAGES/DRESSINGS) ×2
CLSR STERI-STRIP ANTIMIC 1/2X4 (GAUZE/BANDAGES/DRESSINGS) ×1 IMPLANT
COVER BACK TABLE 60X90IN (DRAPES) ×3 IMPLANT
COVER MAYO STAND STRL (DRAPES) ×3 IMPLANT
DECANTER SPIKE VIAL GLASS SM (MISCELLANEOUS) ×3 IMPLANT
DRAIN CHANNEL 10M FLAT 3/4 FLT (DRAIN) IMPLANT
DRAIN CHANNEL 7F 3/4 FLAT (WOUND CARE) IMPLANT
DRAPE LAPAROSCOPIC ABDOMINAL (DRAPES) ×3 IMPLANT
DRSG EMULSION OIL 3X3 NADH (GAUZE/BANDAGES/DRESSINGS) ×3 IMPLANT
DRSG PAD ABDOMINAL 8X10 ST (GAUZE/BANDAGES/DRESSINGS) ×4 IMPLANT
ELECT BLADE 6.5 .24CM SHAFT (ELECTRODE) ×3 IMPLANT
ELECT NDL TIP 2.8 STRL (NEEDLE) IMPLANT
ELECT NEEDLE TIP 2.8 STRL (NEEDLE) IMPLANT
ELECT REM PT RETURN 9FT ADLT (ELECTROSURGICAL) ×3
ELECTRODE REM PT RTRN 9FT ADLT (ELECTROSURGICAL) ×1 IMPLANT
EVACUATOR SILICONE 100CC (DRAIN) IMPLANT
FILTER 7/8 IN (FILTER) ×3 IMPLANT
FILTER LIPOSUCTION (MISCELLANEOUS) IMPLANT
GAUZE SPONGE 4X4 12PLY STRL (GAUZE/BANDAGES/DRESSINGS) ×3 IMPLANT
GLOVE BIO SURGEON STRL SZ7 (GLOVE) ×6 IMPLANT
GOWN STRL REUS W/ TWL LRG LVL3 (GOWN DISPOSABLE) IMPLANT
GOWN STRL REUS W/ TWL XL LVL3 (GOWN DISPOSABLE) IMPLANT
GOWN STRL REUS W/TWL LRG LVL3 (GOWN DISPOSABLE)
GOWN STRL REUS W/TWL XL LVL3 (GOWN DISPOSABLE)
IMPLANT BREAST MP 375CC (Breast) ×2 IMPLANT
IV LACTATED RINGERS 1000ML (IV SOLUTION) IMPLANT
KIT FILL SYSTEM UNIVERSAL (SET/KITS/TRAYS/PACK) ×3 IMPLANT
NDL HYPO 25X1 1.5 SAFETY (NEEDLE) ×2 IMPLANT
NDL SAFETY ECLIPSE 18X1.5 (NEEDLE) IMPLANT
NDL SPNL 18GX3.5 QUINCKE PK (NEEDLE) IMPLANT
NDL SPNL 22GX5 LNG QUINC BK (NEEDLE) ×1 IMPLANT
NEEDLE HYPO 18GX1.5 SHARP (NEEDLE)
NEEDLE HYPO 25X1 1.5 SAFETY (NEEDLE) ×6 IMPLANT
NEEDLE SPNL 18GX3.5 QUINCKE PK (NEEDLE) ×3 IMPLANT
NEEDLE SPNL 22GX5 LNG QUINC BK (NEEDLE) ×3 IMPLANT
NS IRRIG 1000ML POUR BTL (IV SOLUTION) ×3 IMPLANT
PACK BASIN DAY SURGERY FS (CUSTOM PROCEDURE TRAY) ×3 IMPLANT
PIN SAFETY STERILE (MISCELLANEOUS) IMPLANT
SHEET MEDIUM DRAPE 40X70 STRL (DRAPES) IMPLANT
SIZER BREAST SGL USE 350CC (SIZER) ×3
SIZER BRST SGL USE 350CC (SIZER) IMPLANT
SPECIMEN JAR MEDIUM (MISCELLANEOUS) IMPLANT
SPONGE GAUZE 4X4 12PLY STER LF (GAUZE/BANDAGES/DRESSINGS) ×2 IMPLANT
SPONGE LAP 18X18 X RAY DECT (DISPOSABLE) ×6 IMPLANT
STAPLER VISISTAT 35W (STAPLE) ×3 IMPLANT
STRIP CLOSURE SKIN 1/2X4 (GAUZE/BANDAGES/DRESSINGS) ×4 IMPLANT
SUT ETHIBOND 2 OS 4 DA (SUTURE) ×2 IMPLANT
SUT ETHILON 3 0 FSL (SUTURE) IMPLANT
SUT MNCRL AB 3-0 PS2 18 (SUTURE) ×3 IMPLANT
SUT MNCRL AB 4-0 PS2 18 (SUTURE) ×3 IMPLANT
SUT PROLENE 4 0 PS 2 18 (SUTURE) IMPLANT
SYR 20CC LL (SYRINGE) IMPLANT
SYR 50ML LL SCALE MARK (SYRINGE) IMPLANT
SYR BULB IRRIGATION 50ML (SYRINGE) ×6 IMPLANT
SYR CONTROL 10ML LL (SYRINGE) ×6 IMPLANT
SYR TB 1ML LL NO SAFETY (SYRINGE) IMPLANT
TOWEL OR 17X24 6PK STRL BLUE (TOWEL DISPOSABLE) ×9 IMPLANT
TOWEL OR NON WOVEN STRL DISP B (DISPOSABLE) ×3 IMPLANT
TRAY DSU PREP LF (CUSTOM PROCEDURE TRAY) ×3 IMPLANT
TRAY FOLEY CATH SILVER 16FR (SET/KITS/TRAYS/PACK) ×1 IMPLANT
TUBE CONNECTING 20'X1/4 (TUBING) ×1
TUBE CONNECTING 20X1/4 (TUBING) ×2 IMPLANT
TUBING INFILTRATION IT-10001 (TUBING) IMPLANT
TUBING SET GRADUATE ASPIR 12FT (MISCELLANEOUS) IMPLANT
VAC PENCILS W/TUBING CLEAR (MISCELLANEOUS) ×3 IMPLANT
YANKAUER SUCT BULB TIP NO VENT (SUCTIONS) ×3 IMPLANT

## 2015-04-18 NOTE — Brief Op Note (Signed)
04/18/2015  2:42 PM  PATIENT:  Debbie Vargas  59 y.o. female  PRE-OPERATIVE DIAGNOSIS: 1) CANCER OF LEFT BREAST and 2) RUPTURED LEFT BREAST IMPLANT  POST-OPERATIVE DIAGNOSIS:  1) CANCER OF LEFT BREAST and 2) RUPTURED LEFT BREAST IMPLANT  PROCEDURE:  1) REMOVAL AND REPLACEMENT OF  LEFT BREAST IMPLANT FOR LEFT BREAST RECONSTRUCTION  and  2) LEFT OPEN CAPSULECTOMY  SURGEON:  Surgeon(s) and Role:    * Youlanda Roys, MD - Primary  ANESTHESIA:   general  EBL:   Minimal  BLOOD ADMINISTERED:none  DRAINS: none   LOCAL MEDICATIONS USED:  1.3% Exparel (total 266 mgs.)  SPECIMEN:  No Specimen  DISPOSITION OF SPECIMEN:  N/A  COUNTS:  YES  DICTATION: .Other Dictation: Dictation Number [id number]  PLAN OF CARE: Discharge to home after PACU  PATIENT DISPOSITION:  PACU - hemodynamically stable.   Delay start of Pharmacological VTE agent (>24hrs) due to surgical blood loss or risk of bleeding: not applicable

## 2015-04-18 NOTE — Discharge Instructions (Signed)
1. No lifting greater than 5 lbs with arms for 4 weeks. 2. Percocet 5/325 mg tabs 1-2 tabs po q 4-6 hours prn pain- prescription given in office. 3. Duricef 1 tab po bid- prescription given in office. 4. Follow-up appointment tomorrow in office.    Post Anesthesia Home Care Instructions  Activity: Get plenty of rest for the remainder of the day. A responsible adult should stay with you for 24 hours following the procedure.  For the next 24 hours, DO NOT: -Drive a car -Paediatric nurse -Drink alcoholic beverages -Take any medication unless instructed by your physician -Make any legal decisions or sign important papers.  Meals: Start with liquid foods such as gelatin or soup. Progress to regular foods as tolerated. Avoid greasy, spicy, heavy foods. If nausea and/or vomiting occur, drink only clear liquids until the nausea and/or vomiting subsides. Call your physician if vomiting continues.  Special Instructions/Symptoms: Your throat may feel dry or sore from the anesthesia or the breathing tube placed in your throat during surgery. If this causes discomfort, gargle with warm salt water. The discomfort should disappear within 24 hours.  If you had a scopolamine patch placed behind your ear for the management of post- operative nausea and/or vomiting:  1. The medication in the patch is effective for 72 hours, after which it should be removed.  Wrap patch in a tissue and discard in the trash. Wash hands thoroughly with soap and water. 2. You may remove the patch earlier than 72 hours if you experience unpleasant side effects which may include dry mouth, dizziness or visual disturbances. 3. Avoid touching the patch. Wash your hands with soap and water after contact with the patch.    Information for Discharge Teaching: EXPAREL (bupivacaine liposome injectable suspension)   Your surgeon gave you EXPAREL(bupivacaine) in your surgical incision to help control your pain after surgery.    EXPAREL is a local anesthetic that provides pain relief by numbing the tissue around the surgical site.  EXPAREL is designed to release pain medication over time and can control pain for up to 72 hours.  Depending on how you respond to EXPAREL, you may require less pain medication during your recovery.  Possible side effects:  Temporary loss of sensation or ability to move in the area where bupivacaine was injected.  Nausea, vomiting, constipation  Rarely, numbness and tingling in your mouth or lips, lightheadedness, or anxiety may occur.  Call your doctor right away if you think you may be experiencing any of these sensations, or if you have other questions regarding possible side effects.  Follow all other discharge instructions given to you by your surgeon or nurse. Eat a healthy diet and drink plenty of water or other fluids.  If you return to the hospital for any reason within 96 hours following the administration of EXPAREL, please inform your health care providers.

## 2015-04-18 NOTE — Anesthesia Postprocedure Evaluation (Signed)
  Anesthesia Post-op Note  Patient: Debbie Vargas  Procedure(s) Performed: Procedure(s): REMOVE AND REPLACE RUPTURED LEFT BREAST IMPLANTS WITH PARTIAL CAPSULECTOMY (Left)  Patient Location: PACU  Anesthesia Type: General   Level of Consciousness: awake, alert  and oriented  Airway and Oxygen Therapy: Patient Spontanous Breathing  Post-op Pain: mild  Post-op Assessment: Post-op Vital signs reviewed  Post-op Vital Signs: Reviewed  Last Vitals:  Filed Vitals:   04/18/15 1500  BP: 137/67  Pulse: 102  Temp:   Resp: 13    Complications: No apparent anesthesia complications

## 2015-04-18 NOTE — Transfer of Care (Signed)
Immediate Anesthesia Transfer of Care Note  Patient: Debbie Vargas  Procedure(s) Performed: Procedure(s): REMOVE AND REPLACE RUPTURED LEFT BREAST IMPLANTS WITH PARTIAL CAPSULECTOMY (Left)  Patient Location: PACU  Anesthesia Type:General  Level of Consciousness: awake and patient cooperative  Airway & Oxygen Therapy: Patient Spontanous Breathing and Patient connected to face mask oxygen  Post-op Assessment: Report given to RN and Post -op Vital signs reviewed and stable  Post vital signs: Reviewed and stable  Last Vitals:  Filed Vitals:   04/18/15 1128  BP: 136/72  Pulse: 86  Temp: 36.5 C  Resp: 16    Complications: No apparent anesthesia complications

## 2015-04-18 NOTE — Anesthesia Procedure Notes (Signed)
Procedure Name: Intubation Date/Time: 04/18/2015 1:00 PM Performed by: Tawni Millers Pre-anesthesia Checklist: Patient identified, Emergency Drugs available, Suction available and Patient being monitored Patient Re-evaluated:Patient Re-evaluated prior to inductionOxygen Delivery Method: Circle System Utilized Preoxygenation: Pre-oxygenation with 100% oxygen Intubation Type: IV induction Ventilation: Mask ventilation without difficulty Laryngoscope Size: Mac and 3 Grade View: Grade I Tube type: Oral Tube size: 7.0 mm Number of attempts: 1 Airway Equipment and Method: Stylet and Oral airway Placement Confirmation: ETT inserted through vocal cords under direct vision,  positive ETCO2 and breath sounds checked- equal and bilateral Tube secured with: Tape Dental Injury: Teeth and Oropharynx as per pre-operative assessment

## 2015-04-18 NOTE — Anesthesia Preprocedure Evaluation (Signed)
Anesthesia Evaluation  Patient identified by MRN, date of birth, ID band Patient awake    Reviewed: Allergy & Precautions, NPO status , Patient's Chart, lab work & pertinent test results  Airway Mallampati: I  TM Distance: >3 FB Neck ROM: Full    Dental  (+) Teeth Intact, Dental Advisory Given   Pulmonary  breath sounds clear to auscultation        Cardiovascular Rhythm:Regular Rate:Normal     Neuro/Psych    GI/Hepatic   Endo/Other    Renal/GU      Musculoskeletal   Abdominal   Peds  Hematology   Anesthesia Other Findings   Reproductive/Obstetrics                             Anesthesia Physical Anesthesia Plan  ASA: I  Anesthesia Plan: General   Post-op Pain Management:    Induction: Intravenous  Airway Management Planned: Oral ETT  Additional Equipment:   Intra-op Plan:   Post-operative Plan: Extubation in OR  Informed Consent: I have reviewed the patients History and Physical, chart, labs and discussed the procedure including the risks, benefits and alternatives for the proposed anesthesia with the patient or authorized representative who has indicated his/her understanding and acceptance.   Dental advisory given  Plan Discussed with: CRNA, Anesthesiologist and Surgeon  Anesthesia Plan Comments:         Anesthesia Quick Evaluation

## 2015-04-19 ENCOUNTER — Encounter (HOSPITAL_BASED_OUTPATIENT_CLINIC_OR_DEPARTMENT_OTHER): Payer: Self-pay | Admitting: Plastic Surgery

## 2015-05-10 NOTE — Op Note (Signed)
NAMEROYA, Debbie Vargas NO.:  [id number]  MEDICAL RECORD NO.:  [id number]  LOCATION:                               FACILITY:  [facility]  PHYSICIAN:  Hetty Blend, M.D.DATE OF BIRTH:  1956-03-24  DATE OF PROCEDURE:  04/18/2015 DATE OF DISCHARGE:  04/18/2015                              OPERATIVE REPORT   PREOPERATIVE DIAGNOSIS: 1. History of left breast cancer. 2. Ruptured left breast saline implant.  POSTOPERATIVE DIAGNOSIS: 1. History of left breast cancer. 2. Ruptured left breast saline implant.  PROCEDURES: 1. Removal of ruptured left saline breast implant. 2. Replacement of left breast saline implant for reconstruction. 3. Left breast open capsulectomy.  ATTENDING SURGEON:  Hetty Blend, M.D.  COMPLICATIONS:  None.  INDICATIONS FOR THE PROCEDURE:  The patient is a 59 year old Caucasian female who was diagnosed with left breast cancer in 2000.  As a result, she underwent a mastectomy followed by tissue expansion and placement of a saline implant in a subpectoral position.  A week or two before surgery, she has developed a rupture of the left breast implant.  She therefore presents to undergo removal and replacement of the left breast implant for her breast reconstruction and the capsulectomy.  IMPLANTS USED:  Mentor style #3000, reference [id number], lot #[id number], 375 mL round smooth moderate plus profile saline implant, filled to a total of 450 mL.  DESCRIPTION OF PROCEDURE:  The patient was marked in the preop holding area for the future revision of the breast reconstruction surgery.  She was then taken back to the OR, placed on the table in supine position.  After adequate general anesthesia was obtained, the patient's chest was prepped with Techni-Care and draped in sterile fashion.  The base of the left chest wall and breast tissue were then infiltrated with 1% lidocaine with epinephrine. After adequate hemostasis and anesthesia taken  effect, the procedure was begun.  It was obvious that the existing left breast saline implant had ruptured.  I used the same incision that she had from her mastectomy for access incision for the surgery.  The incision was made with a knife and the old scar was excised.  The excised mastectomy scar was then passed off the table to undergo permanent pathologic section evaluation.  Upon entering the subcutaneous space, the pectoralis major muscle was identified.  I made a small incision into the muscle.  Upon entering the subpectoral space, it was evident that the saline implant was ruptured. The remaining saline was evacuated, and the ruptured saline implant removed.  A partial open capsulectomy was performed along with a capsulotomy in some areas in order to better shape the patient's left breast implant pocket and to accept the implant that was being placed. When the patient had original surgery, her breasts were smaller and so she had a smaller implant.  However today, we will go with a larger implant to better match the volume of her right breast.  The left subpectoral pocket was then irrigated with saline irrigation. Meticulous hemostasis was obtained with the Bovie electrocautery.  A temporary breast implant sizer was placed into the left subpectoral pocket and filled to 450 mL.  This  gave a nice look to the patient's left breast.  The implant sizer was then removed and once again, meticulous hemostasis was obtained.  The implant pocket was then irrigated with saline irrigation followed by antibiotic irrigation and then a dilute Betadine irrigation.  A Mentor 375 mL round smooth moderate plus profile saline implant was placed into the left subpectoral pocket and filled to a total of 450 mL.  The incision was then tacked together.  The patient was then placed in the upright position. Minor modifications were made to the implant pocket in order to position the implant better.  She was  then placed back in the recumbent position. The wound was then closed in multiple layers.  The pectoralis major muscle was then closed using a 2-0 Ethibond suture.  The deeper subcutaneous tissue was then closed using 3-0 Monocryl suture.  The deep dermal layer was also closed with 3-0 Monocryl suture.  The skin was then closed with a 4-0 Monocryl in a running intracuticular stitch. Prior to closing the incision after placing the final implant, the chest wall musculature and breast tissue were infiltrated with 1.3% Exparel (total of 266 mg) in order to provide postoperative pain control for the patient.  A saline implant was used and filled to a total of 450 mL. The incision was then dressed with bacitracin ointment, Xeroform, 4x4 gauze and Hypafix tape.  She was placed into a post-op garment consisting of a band bra along with a light postoperative support bra.  There are no complications.  The patient tolerated the procedure well.  The final needle and sponge counts were reported to be correct at the end the case.  The patient was then extubated and taken to the recovery room in stable condition.  She was also recovered without complications.  Both the patient and her family were given proper postoperative wound care instructions.  She was then discharged in the care of her family in stable condition.  Followup appointment will be within a few days in the office.          ______________________________ Hetty Blend, M.D.     MC/MEDQ  D:  05/10/2015  T:  05/10/2015  Job:  [id number]

## 2015-06-29 ENCOUNTER — Other Ambulatory Visit (HOSPITAL_BASED_OUTPATIENT_CLINIC_OR_DEPARTMENT_OTHER): Payer: BC Managed Care – PPO

## 2015-06-29 ENCOUNTER — Encounter: Payer: Self-pay | Admitting: Hematology & Oncology

## 2015-06-29 ENCOUNTER — Ambulatory Visit (HOSPITAL_BASED_OUTPATIENT_CLINIC_OR_DEPARTMENT_OTHER): Payer: BC Managed Care – PPO

## 2015-06-29 ENCOUNTER — Ambulatory Visit (HOSPITAL_BASED_OUTPATIENT_CLINIC_OR_DEPARTMENT_OTHER): Payer: BC Managed Care – PPO | Admitting: Hematology & Oncology

## 2015-06-29 VITALS — BP 122/71 | HR 97 | Temp 97.7°F | Resp 14 | Ht 61.0 in | Wt 130.0 lb

## 2015-06-29 DIAGNOSIS — C50419 Malignant neoplasm of upper-outer quadrant of unspecified female breast: Secondary | ICD-10-CM

## 2015-06-29 DIAGNOSIS — Z853 Personal history of malignant neoplasm of breast: Secondary | ICD-10-CM | POA: Diagnosis not present

## 2015-06-29 DIAGNOSIS — L539 Erythematous condition, unspecified: Secondary | ICD-10-CM

## 2015-06-29 DIAGNOSIS — C50912 Malignant neoplasm of unspecified site of left female breast: Secondary | ICD-10-CM

## 2015-06-29 DIAGNOSIS — L03313 Cellulitis of chest wall: Secondary | ICD-10-CM

## 2015-06-29 DIAGNOSIS — C50012 Malignant neoplasm of nipple and areola, left female breast: Secondary | ICD-10-CM

## 2015-06-29 LAB — CMP (CANCER CENTER ONLY)
ALT(SGPT): 24 U/L (ref 10–47)
AST: 32 U/L (ref 11–38)
Albumin: 4.1 g/dL (ref 3.3–5.5)
Alkaline Phosphatase: 56 U/L (ref 26–84)
BUN, Bld: 10 mg/dL (ref 7–22)
CO2: 29 mEq/L (ref 18–33)
CREATININE: 1 mg/dL (ref 0.6–1.2)
Calcium: 9.6 mg/dL (ref 8.0–10.3)
Chloride: 100 mEq/L (ref 98–108)
GLUCOSE: 98 mg/dL (ref 73–118)
Potassium: 4.4 mEq/L (ref 3.3–4.7)
SODIUM: 138 meq/L (ref 128–145)
Total Bilirubin: 0.7 mg/dl (ref 0.20–1.60)
Total Protein: 7.6 g/dL (ref 6.4–8.1)

## 2015-06-29 LAB — CBC WITH DIFFERENTIAL (CANCER CENTER ONLY)
BASO#: 0 10*3/uL (ref 0.0–0.2)
BASO%: 0.3 % (ref 0.0–2.0)
EOS ABS: 0.2 10*3/uL (ref 0.0–0.5)
EOS%: 2.1 % (ref 0.0–7.0)
HCT: 44.4 % (ref 34.8–46.6)
HGB: 14.8 g/dL (ref 11.6–15.9)
LYMPH#: 1.2 10*3/uL (ref 0.9–3.3)
LYMPH%: 13.6 % — AB (ref 14.0–48.0)
MCH: 28.8 pg (ref 26.0–34.0)
MCHC: 33.3 g/dL (ref 32.0–36.0)
MCV: 86 fL (ref 81–101)
MONO#: 0.5 10*3/uL (ref 0.1–0.9)
MONO%: 6.2 % (ref 0.0–13.0)
NEUT#: 6.8 10*3/uL — ABNORMAL HIGH (ref 1.5–6.5)
NEUT%: 77.8 % (ref 39.6–80.0)
PLATELETS: 303 10*3/uL (ref 145–400)
RBC: 5.14 10*6/uL (ref 3.70–5.32)
RDW: 12.4 % (ref 11.1–15.7)
WBC: 8.8 10*3/uL (ref 3.9–10.0)

## 2015-06-29 MED ORDER — SODIUM CHLORIDE 0.9 % IV SOLN
6.0000 mg/kg | INTRAVENOUS | Status: DC
  Administered 2015-06-29: 354 mg via INTRAVENOUS
  Filled 2015-06-29: qty 7.08

## 2015-06-29 MED ORDER — CEPHALEXIN 500 MG PO CAPS: 500.0000 mg | ORAL_CAPSULE | Freq: Four times a day (QID) | ORAL | Status: DC

## 2015-06-29 NOTE — Patient Instructions (Signed)
Daptomycin injection  What is this medicine?  DAPTOMYCIN (DAP toe MYE sin) is a lipopeptide antibiotic. It is used to treat certain kinds of bacterial infections. It will not work for colds, flu, or other viral infections.  This medicine may be used for other purposes; ask your health care provider or pharmacist if you have questions.  What should I tell my health care provider before I take this medicine?  They need to know if you have any of these conditions:  -kidney disease  -an unusual or allergic reaction to daptomycin, other medicines, foods, dyes, or preservatives  -pregnant or trying to get pregnant  -breast-feeding  How should I use this medicine?  This medicine is for infusion into a vein. It is usually given by a health care professional in a hospital or clinic setting.  If you get this medicine at home, you will be taught how to prepare and give this medicine. Use exactly as directed. Take your medicine at regular intervals. Do not take your medicine more often than directed. Take all of your medicine as directed even if you think you are better. Do not skip doses or stop your medicine early.  It is important that you put your used needles and syringes in a special sharps container. Do not put them in a trash can. If you do not have a sharps container, call your pharmacist or healthcare provider to get one.  Talk to your pediatrician regarding the use of this medicine in children. Special care may be needed.  Overdosage: If you think you have taken too much of this medicine contact a poison control center or emergency room at once.  NOTE: This medicine is only for you. Do not share this medicine with others.  What if I miss a dose?  If you miss a dose, take it as soon as you can. If it is almost time for your next dose, take only that dose. Do not take double or extra doses.  What may interact with this medicine?  -birth control pills  -some antibiotics like tobramycin  This list may not describe all  possible interactions. Give your health care provider a list of all the medicines, herbs, non-prescription drugs, or dietary supplements you use. Also tell them if you smoke, drink alcohol, or use illegal drugs. Some items may interact with your medicine.  What should I watch for while using this medicine?  Your condition will be monitored carefully while you are receiving this medicine.  Do not treat diarrhea with over the counter products. Contact your doctor if you have diarrhea that lasts more than 2 days or if it is severe and watery.  What side effects may I notice from receiving this medicine?  Side effects that you should report to your doctor or health care professional as soon as possible:  -allergic reactions like skin rash, itching or hives, swelling of the face, lips, or tongue  -breathing problems  -fever, infection  -high or low blood pressure  -muscle pain  -numb or tingling pain  -trouble passing urine or change in the amount of urine  -unusually tired or weak  -vomiting  Side effects that usually do not require medical attention (report to your doctor or health care professional if they continue or are bothersome):  -constipation or diarrhea  -trouble sleeping  -headache  -nausea  -stomach upset  This list may not describe all possible side effects. Call your doctor for medical advice about side effects. You may report   side effects to FDA at 1-800-FDA-1088.  Where should I keep my medicine?  Keep out of the reach of children.  If you are using this medicine at home, you will be instructed on how to store this medicine. Throw away any unused medicine after the expiration date on the label.  NOTE: This sheet is a summary. It may not cover all possible information. If you have questions about this medicine, talk to your doctor, pharmacist, or health care provider.      2016, Elsevier/Gold Standard. (2013-04-15 07:33:48)

## 2015-06-29 NOTE — Addendum Note (Signed)
Addended by: Burney Gauze R on: 06/29/2015 10:20 AM   Modules accepted: Orders

## 2015-06-29 NOTE — Progress Notes (Signed)
Hematology and Oncology Follow Up Visit  Debbie Vargas [id number] 1956-04-16 59 y.o. 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  Current Therapy:    Observation     Interim History:  Ms.  Vargas is back for followup. We see her yearly. 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  She apparently had deflation of the left breast implant. It was leaking. She had it repaired over the summer. She had new implant placed.  Unfortunately, it looks like she has cellulitis on the left implant. She noticed this a week or so ago. It initially improved but then has worsened.  I believe that she probably will need some IV antibiotics. I don't think that any kind of drainage is necessary.  She's had no fever. She's had no sweats. She's had no cough. She's had some diarrhea from the antibiotics. She is on a probiotic.    Medications:  Current outpatient prescriptions:  .  aspirin 81 MG tablet, Take 81 mg by mouth daily.  , Disp: , Rfl:  .  Biotin 10 MG TABS, Take by mouth every morning., Disp: , Rfl:  .  Calcium-Vitamin D (CALTRATE 600 PLUS-VIT D PO), Take 1,500 mg by mouth daily. , Disp: , Rfl:  .  clindamycin (CLEOCIN) 300 MG capsule, , Disp: , Rfl:  .  Multiple Vitamin (MULTIVITAMIN) capsule, Take 1 capsule by mouth daily.  , Disp: , Rfl:   Current facility-administered medications:  .  DAPTOmycin (CUBICIN) 354 mg in sodium chloride 0.9 % IVPB, 6 mg/kg, Intravenous, Q24H, Volanda Napoleon, MD  Allergies: No Known Allergies  Past Medical History, Surgical history, Social history, and Family History were reviewed and updated.  Review of Systems: As above  Physical Exam:  height is 5\' 1"  (1.549 m) and weight is 130 lb (58.968 kg). Her oral temperature is 97.7 F (36.5 C). Her blood pressure is 122/71 and her pulse is 97. Her respiration is 14.   Well-developed and well-nourished white female. Head and neck exam shows no  ocular or oral lesions. There are no palpable cervical or supraclavicular lymph nodes. Lungs are clear. Cardiac exam regular rate and rhythm with no murmurs, rubs or bruits. Breast exam shows progress with some breast reduction scars. No masses are noted in the right breast. Left chest wall shows the implant. There is marked erythema over the skin of the implant. There is no fluctuation of the implant site. There is some slight tenderness. There is no left axillary adenopathy. Abdomen is soft. Has good bowel sounds. There is no fluid wave is no palpable liver or spleen. Extremities shows no clubbing, cyanosis or edema. Neurological exam shows no focal neurological deficits. Skin exam no rashes, ecchymosis or petechia. Again, she does have erythema over the implant site on the left anterior chest wall.  Lab Results  Component Value Date   WBC 8.8 06/29/2015   HGB 14.8 06/29/2015   HCT 44.4 06/29/2015   MCV 86 06/29/2015   PLT 303 06/29/2015     Chemistry      Component Value Date/Time   NA 138 06/29/2015 0845   NA 138 07/01/2013 0830   K 4.4 06/29/2015 0845   K 4.5 07/01/2013 0830   CL 100 06/29/2015 0845   CL 102 07/01/2013 0830   CO2 29 06/29/2015 0845   CO2 31 07/01/2013 0830   BUN 10 06/29/2015 0845   BUN 11 07/01/2013 0830   CREATININE  1.0 06/29/2015 0845   CREATININE 0.80 07/01/2013 0830      Component Value Date/Time   CALCIUM 9.6 06/29/2015 0845   CALCIUM 10.0 07/01/2013 0830   ALKPHOS 56 06/29/2015 0845   ALKPHOS 62 07/01/2013 0830   AST 32 06/29/2015 0845   AST 25 07/01/2013 0830   ALT 24 06/29/2015 0845   ALT 22 07/01/2013 0830   BILITOT 0.70 06/29/2015 0845   BILITOT 0.5 07/01/2013 0830         Impression and Plan: Debbie Vargas is 59 year old white female.she is now out by about 17 years.  I'm really worried about the erythema over the left breast implant. It's really looks like she has cellulitis. She has been on some oral antibiotics. It is apparent that there  really are not doing the "job".  I will go ahead and give her a dose of Cubicin. I think this would be very reasonable. I will then see about getting her on Keflex.  I'll like to have her come back on Monday so we can reassess the area of cellulitis.  Typically, I will see her back yearly. However, I think that given the situation with the cellulitis, we have to get her back and be aggressive so that she does not and up in the hospital needing some kind of surgical release.    Volanda Napoleon, MD 10/7/201610:01 AM

## 2015-07-02 ENCOUNTER — Ambulatory Visit (HOSPITAL_BASED_OUTPATIENT_CLINIC_OR_DEPARTMENT_OTHER): Payer: BC Managed Care – PPO | Admitting: Family

## 2015-07-02 ENCOUNTER — Ambulatory Visit (HOSPITAL_BASED_OUTPATIENT_CLINIC_OR_DEPARTMENT_OTHER): Payer: BC Managed Care – PPO

## 2015-07-02 ENCOUNTER — Other Ambulatory Visit (HOSPITAL_BASED_OUTPATIENT_CLINIC_OR_DEPARTMENT_OTHER): Payer: BC Managed Care – PPO

## 2015-07-02 ENCOUNTER — Encounter: Payer: Self-pay | Admitting: Family

## 2015-07-02 ENCOUNTER — Encounter: Payer: Self-pay | Admitting: *Deleted

## 2015-07-02 VITALS — BP 130/67 | HR 105 | Temp 97.6°F | Resp 16 | Ht 61.0 in | Wt 130.0 lb

## 2015-07-02 DIAGNOSIS — Z853 Personal history of malignant neoplasm of breast: Secondary | ICD-10-CM

## 2015-07-02 DIAGNOSIS — N61 Mastitis without abscess: Secondary | ICD-10-CM

## 2015-07-02 DIAGNOSIS — C50012 Malignant neoplasm of nipple and areola, left female breast: Secondary | ICD-10-CM

## 2015-07-02 DIAGNOSIS — L03313 Cellulitis of chest wall: Secondary | ICD-10-CM

## 2015-07-02 DIAGNOSIS — C50419 Malignant neoplasm of upper-outer quadrant of unspecified female breast: Secondary | ICD-10-CM

## 2015-07-02 LAB — VITAMIN D 25 HYDROXY (VIT D DEFICIENCY, FRACTURES): VIT D 25 HYDROXY: 45 ng/mL (ref 30–100)

## 2015-07-02 MED ORDER — SODIUM CHLORIDE 0.9 % IV SOLN
6.0000 mg/kg | Freq: Once | INTRAVENOUS | Status: AC
  Administered 2015-07-02: 354 mg via INTRAVENOUS
  Filled 2015-07-02: qty 7.08

## 2015-07-02 NOTE — Progress Notes (Signed)
Hematology and Oncology Follow Up Visit  Debbie Vargas [id number] June 07, 1956 59 y.o. 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 She was here on Friday for assessment of cellulitis of the left breast. She received Cubicin IV at that point and went home on Keflex.  She still has a great deal of redness and swelling around the left breast. She states that "it may be a little better." The area is still hot to the touch. She has not had a fever since yesterday.  She states that with the nerve damage after her reconstruction she has not had a lot of pain in that area only pressure. This does cause her some discomfort.  There is a small open area that is draining yellow fluid. She is keeping it clean and covered with gauze. I did culture this today.  She has had no other symptoms at this time. She denies fatigue, body aches, n/v, dizziness, SOB, chest pain, palpitations, changes in bowel or bladder habits.  No swelling, tenderness, numbness or tingling in her extremities.  She is eating well and staying hydrated. Her weight is stable.    Medications:    Medication List       This list is accurate as of: 07/02/15  2:43 PM.  Always use your most recent med list.               aspirin 81 MG tablet  Take 81 mg by mouth daily.     Biotin 10 MG Tabs  Take by mouth every morning.     CALTRATE 600 PLUS-VIT D PO  Take 1,500 mg by mouth daily.     cephALEXin 500 MG capsule  Commonly known as:  KEFLEX  Take 1 capsule (500 mg total) by mouth 4 (four) times daily.     clindamycin 300 MG capsule  Commonly known as:  CLEOCIN     multivitamin capsule  Take 1 capsule by mouth daily.        Allergies: No Known Allergies  Past Medical History, Surgical history, Social history, and Family History were reviewed and updated.  Review of  Systems: All other 10 point review of systems is negative.   Physical Exam:  vitals were not taken for this visit.  Wt Readings from Last 3 Encounters:  06/29/15 130 lb (58.968 kg)  04/18/15 129 lb 4 oz (58.627 kg)  06/30/14 137 lb (62.143 kg)    Ocular: Sclerae unicteric, pupils equal, round and reactive to light Ear-nose-throat: Oropharynx clear, dentition fair Lymphatic: No cervical or supraclavicular adenopathy Lungs no rales or rhonchi, good excursion bilaterally Heart regular rate and rhythm, no murmur appreciated Abd soft, nontender, positive bowel sounds MSK no focal spinal tenderness, no joint edema Neuro: non-focal, well-oriented, appropriate affect Breasts: No changes with the right breast, no mass, lesion or rash. The left breast is red and hot to the touch. She has a small open area at the 3 o'clock position that is draining yellow fluid. No lymphadenopathy found on exam.   Lab Results  Component Value Date   WBC 8.8 06/29/2015   HGB 14.8 06/29/2015   HCT 44.4 06/29/2015   MCV 86 06/29/2015   PLT 303 06/29/2015   No results found for: FERRITIN, IRON, TIBC, UIBC, IRONPCTSAT Lab Results  Component Value Date   RBC 5.14 06/29/2015  No results found for: KPAFRELGTCHN, LAMBDASER, KAPLAMBRATIO No results found for: IGGSERUM, IGA, IGMSERUM No results found for: Odetta Pink, SPEI   Chemistry      Component Value Date/Time   NA 138 06/29/2015 0845   NA 138 07/01/2013 0830   K 4.4 06/29/2015 0845   K 4.5 07/01/2013 0830   CL 100 06/29/2015 0845   CL 102 07/01/2013 0830   CO2 29 06/29/2015 0845   CO2 31 07/01/2013 0830   BUN 10 06/29/2015 0845   BUN 11 07/01/2013 0830   CREATININE 1.0 06/29/2015 0845   CREATININE 0.80 07/01/2013 0830      Component Value Date/Time   CALCIUM 9.6 06/29/2015 0845   CALCIUM 10.0 07/01/2013 0830   ALKPHOS 56 06/29/2015 0845   ALKPHOS 62 07/01/2013 0830   AST 32 06/29/2015  0845   AST 25 07/01/2013 0830   ALT 24 06/29/2015 0845   ALT 22 07/01/2013 0830   BILITOT 0.70 06/29/2015 0845   BILITOT 0.5 07/01/2013 0830     Impression and Plan: Debbie Vargas is 59 yo white female with history of locally recurrent ductal carcinoma of the left breast out 17 years now from diagnosis. She had her implant replaced over the summer after her first deflated.  She now has cellulitis of the left breast that is not much improved since her initial visit on Friday.  I cultured the fluid from the open area.  We will give her another dose of Cubicin IV today. She will continue on Keflex for now.  Dr. Marin Olp did speak with Dr. Nathanial Rancher over the phone about her and she plans to call and bring her into the office tomorrow for evaluation and further treatment.  We will plan to see her back in 1 year for follow-up and lab work. She will contact us with any questions or concerns. We can certainly see her sooner if need be.   Eliezer Bottom, NP 10/10/20162:43 PM

## 2015-07-02 NOTE — Patient Instructions (Signed)
Daptomycin injection  What is this medicine?  DAPTOMYCIN (DAP toe MYE sin) is a lipopeptide antibiotic. It is used to treat certain kinds of bacterial infections. It will not work for colds, flu, or other viral infections.  This medicine may be used for other purposes; ask your health care provider or pharmacist if you have questions.  What should I tell my health care provider before I take this medicine?  They need to know if you have any of these conditions:  -kidney disease  -an unusual or allergic reaction to daptomycin, other medicines, foods, dyes, or preservatives  -pregnant or trying to get pregnant  -breast-feeding  How should I use this medicine?  This medicine is for infusion into a vein. It is usually given by a health care professional in a hospital or clinic setting.  If you get this medicine at home, you will be taught how to prepare and give this medicine. Use exactly as directed. Take your medicine at regular intervals. Do not take your medicine more often than directed. Take all of your medicine as directed even if you think you are better. Do not skip doses or stop your medicine early.  It is important that you put your used needles and syringes in a special sharps container. Do not put them in a trash can. If you do not have a sharps container, call your pharmacist or healthcare provider to get one.  Talk to your pediatrician regarding the use of this medicine in children. Special care may be needed.  Overdosage: If you think you have taken too much of this medicine contact a poison control center or emergency room at once.  NOTE: This medicine is only for you. Do not share this medicine with others.  What if I miss a dose?  If you miss a dose, take it as soon as you can. If it is almost time for your next dose, take only that dose. Do not take double or extra doses.  What may interact with this medicine?  -birth control pills  -some antibiotics like tobramycin  This list may not describe all  possible interactions. Give your health care provider a list of all the medicines, herbs, non-prescription drugs, or dietary supplements you use. Also tell them if you smoke, drink alcohol, or use illegal drugs. Some items may interact with your medicine.  What should I watch for while using this medicine?  Your condition will be monitored carefully while you are receiving this medicine.  Do not treat diarrhea with over the counter products. Contact your doctor if you have diarrhea that lasts more than 2 days or if it is severe and watery.  What side effects may I notice from receiving this medicine?  Side effects that you should report to your doctor or health care professional as soon as possible:  -allergic reactions like skin rash, itching or hives, swelling of the face, lips, or tongue  -breathing problems  -fever, infection  -high or low blood pressure  -muscle pain  -numb or tingling pain  -trouble passing urine or change in the amount of urine  -unusually tired or weak  -vomiting  Side effects that usually do not require medical attention (report to your doctor or health care professional if they continue or are bothersome):  -constipation or diarrhea  -trouble sleeping  -headache  -nausea  -stomach upset  This list may not describe all possible side effects. Call your doctor for medical advice about side effects. You may report   side effects to FDA at 1-800-FDA-1088.  Where should I keep my medicine?  Keep out of the reach of children.  If you are using this medicine at home, you will be instructed on how to store this medicine. Throw away any unused medicine after the expiration date on the label.  NOTE: This sheet is a summary. It may not cover all possible information. If you have questions about this medicine, talk to your doctor, pharmacist, or health care provider.      2016, Elsevier/Gold Standard. (2013-04-15 07:33:48)

## 2015-07-05 LAB — WOUND CULTURE

## 2015-07-06 ENCOUNTER — Ambulatory Visit
Admission: RE | Admit: 2015-07-06 | Discharge: 2015-07-06 | Disposition: A | Discharge status: Home / Self Care | Payer: BC Managed Care – PPO | Source: Ambulatory Visit | Attending: Plastic Surgery | Admitting: Plastic Surgery

## 2015-07-06 ENCOUNTER — Other Ambulatory Visit: Payer: Self-pay | Admitting: Plastic Surgery

## 2015-07-06 DIAGNOSIS — N61 Mastitis without abscess: Secondary | ICD-10-CM

## 2015-07-13 ENCOUNTER — Encounter: Payer: Self-pay | Admitting: Cardiology

## 2015-07-13 ENCOUNTER — Ambulatory Visit (INDEPENDENT_AMBULATORY_CARE_PROVIDER_SITE_OTHER): Payer: BC Managed Care – PPO | Admitting: Cardiology

## 2015-07-13 VITALS — BP 118/70 | HR 84 | Ht 61.0 in | Wt 126.0 lb

## 2015-07-13 DIAGNOSIS — R42 Dizziness and giddiness: Secondary | ICD-10-CM | POA: Diagnosis not present

## 2015-07-13 DIAGNOSIS — R002 Palpitations: Secondary | ICD-10-CM

## 2015-07-13 DIAGNOSIS — R000 Tachycardia, unspecified: Secondary | ICD-10-CM | POA: Diagnosis not present

## 2015-07-13 NOTE — Progress Notes (Signed)
Patient ID: Debbie Vargas, female   DOB: 1955-12-14, 59 y.o.   MRN: [id number]      Cardiology Office Note  Date:  07/13/2015   ID:  Debbie Vargas, DOB 1956/05/16, MRN [id number]  PCP:  Osborne Casco, MD  Cardiologist: Dorothy Spark, MD   Chief complain: Palpitations   History of Present Illness: Debbie Vargas is a 59 y.o. 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 The patient doesn't know what chemo she git and there are no records. She has developed cellulitis on the left implant. In the last months she was treated with 3 different antibiotics, the last was Bactrim. During the treatment she developed palpitations that would last for hours and would be associated with dizziness but no syncope. No SOB, or CP.  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 The patient visited her PCP and ECG was acquired while she was having palpitations, it showed sinus tachycardia with ventricular rate of 130 BPM. She was also hypertensive at the time.  Toprol XL 50 mg daily was initiated. She feels some improvement of symptoms but they are persistent. She denies FH of premature CAD or CHF. Denies LE edema, orthopnea or PND.  Past Medical History  Diagnosis Date  . Breast cancer, female ([place]) 08/13/2011    Past Surgical History  Procedure Laterality Date  . Mastectomy      left  . Cesarean section    . Breast surgery Left 1998    mastectomy  . Breast reconstruction  2000    after left mastectomy  . Removal of tissue expander and placement of implant Left 04/18/2015    Procedure: REMOVE AND REPLACE RUPTURED LEFT BREAST IMPLANTS WITH PARTIAL CAPSULECTOMY;  Surgeon: Youlanda Roys, MD;  Location: [facility];  Service: Plastics;  Laterality: Left;     Current Outpatient Prescriptions  Medication Sig Dispense Refill  . aspirin 81 MG tablet Take 81 mg  by mouth daily.      . Biotin 10 MG TABS Take by mouth every morning.    . Calcium-Vitamin D (CALTRATE 600 PLUS-VIT D PO) Take 1,500 mg by mouth daily.     . metoprolol succinate (TOPROL-XL) 50 MG 24 hr tablet Take 50 mg by mouth daily.  0  . Multiple Vitamin (MULTIVITAMIN) capsule Take 1 capsule by mouth daily.      Marland Kitchen sulfamethoxazole-trimethoprim (BACTRIM DS,SEPTRA DS) 800-160 MG tablet Take 2 tablet by mouth twice daily for 10 days  0   No current facility-administered medications for this visit.    Allergies:   Review of patient's allergies indicates no known allergies.   Social History:  The patient  reports that she has never smoked. She has never used smokeless tobacco. She reports that she drinks alcohol. She reports that she does not use illicit drugs.   Family History:  The patient's family history is not on file.   ROS:  Please see the history of present illness.    All other systems are reviewed and negative.   PHYSICAL EXAM: VS:  BP 118/70 mmHg  Pulse 84  Ht 5\' 1"  (1.549 m)  Wt 126 lb (57.153 kg)  BMI 23.82 kg/m2 , BMI Body mass index is 23.82 kg/(m^2). GEN: Well nourished, well developed, in no acute distress HEENT: normal Neck: no JVD, carotid bruits, or masses Cardiac: RRR; no murmurs, rubs, or  gallops,no edema  Respiratory:  clear to auscultation bilaterally, normal work of breathing GI: soft, nontender, nondistended, + BS MS: no deformity or atrophy Skin: warm and dry, no rash Neuro:  Strength and sensation are intact Psych: euthymic mood, full affect  Recent Labs: 06/29/2015: ALT(SGPT) 24; BUN, Bld 10; Creat 1.0; HGB 14.8; Platelets 303; Potassium 4.4; Sodium 138   Lipid Panel No results found for: CHOL, TRIG, HDL, CHOLHDL, VLDL, LDLCALC, LDLDIRECT  Wt Readings from Last 3 Encounters:  07/13/15 126 lb (57.153 kg)  07/02/15 130 lb (58.968 kg)  06/29/15 130 lb (58.968 kg)    ECG: SR, normal ECG   ASSESSMENT AND PLAN:  1.  Palpitations - ECG shows  sinus tachycardia, agree with Toprol XL, we will order a 24 hour Holter monitor to evaluate for inappropriate sinus tachycardia. Its possible that this is a reaction to antibiotics. The most worrisome would be underlying CHF, however she doesn't have symptoms. Her labs including TSH were normal.  2. S/p chemotherapy for breast ca 20 years ago, possibly anthracyclines, we will schedule an echocardiogram to evaluate for LV EF.  Follow up in 6 weeks.  Signed, Dorothy Spark, MD  07/13/2015 8:41 AM    [address], [place], [place]  [address] Phone: [phone]; Fax: [phone]

## 2015-07-13 NOTE — Patient Instructions (Addendum)
Your physician recommends that you continue on your current medications as directed. Please refer to the Current Medication list given to you today. Your physician has requested that you have an echocardiogram. Echocardiography is a painless test that uses sound waves to create images of your heart. It provides your doctor with information about the size and shape of your heart and how well your heart's chambers and valves are working. This procedure takes approximately one hour. There are no restrictions for this procedure.  Your physician has recommended that you wear a holter monitor. Holter monitors are medical devices that record the heart's electrical activity. Doctors most often use these monitors to diagnose arrhythmias. Arrhythmias are problems with the speed or rhythm of the heartbeat. The monitor is a small, portable device. You can wear one while you do your normal daily activities. This is usually used to diagnose what is causing palpitations/syncope (passing out).  Your physician recommends that you schedule a follow-up appointment in: [place].

## 2015-07-18 ENCOUNTER — Other Ambulatory Visit: Payer: Self-pay | Admitting: Cardiology

## 2015-07-18 DIAGNOSIS — R42 Dizziness and giddiness: Secondary | ICD-10-CM

## 2015-07-18 DIAGNOSIS — R002 Palpitations: Secondary | ICD-10-CM

## 2015-07-19 ENCOUNTER — Other Ambulatory Visit: Payer: Self-pay

## 2015-07-19 ENCOUNTER — Ambulatory Visit (INDEPENDENT_AMBULATORY_CARE_PROVIDER_SITE_OTHER): Payer: BC Managed Care – PPO

## 2015-07-19 ENCOUNTER — Ambulatory Visit (HOSPITAL_COMMUNITY): Payer: BC Managed Care – PPO | Attending: Cardiovascular Disease

## 2015-07-19 DIAGNOSIS — R002 Palpitations: Secondary | ICD-10-CM | POA: Diagnosis present

## 2015-07-19 DIAGNOSIS — I340 Nonrheumatic mitral (valve) insufficiency: Secondary | ICD-10-CM | POA: Insufficient documentation

## 2015-07-19 DIAGNOSIS — R42 Dizziness and giddiness: Secondary | ICD-10-CM

## 2015-07-19 DIAGNOSIS — Z853 Personal history of malignant neoplasm of breast: Secondary | ICD-10-CM | POA: Diagnosis not present

## 2015-07-27 ENCOUNTER — Encounter: Payer: Self-pay | Admitting: Cardiology

## 2015-08-01 ENCOUNTER — Ambulatory Visit (INDEPENDENT_AMBULATORY_CARE_PROVIDER_SITE_OTHER): Payer: BC Managed Care – PPO | Admitting: Cardiology

## 2015-08-01 ENCOUNTER — Encounter: Payer: Self-pay | Admitting: Cardiology

## 2015-08-01 VITALS — BP 120/62 | HR 78 | Ht 61.0 in | Wt 129.0 lb

## 2015-08-01 DIAGNOSIS — R002 Palpitations: Secondary | ICD-10-CM | POA: Diagnosis not present

## 2015-08-01 DIAGNOSIS — T451X5S Adverse effect of antineoplastic and immunosuppressive drugs, sequela: Secondary | ICD-10-CM

## 2015-08-01 MED ORDER — METOPROLOL SUCCINATE ER 50 MG PO TB24: 50.0000 mg | ORAL_TABLET | Freq: Every day | ORAL | Status: DC

## 2015-08-01 NOTE — Progress Notes (Signed)
Patient ID: Debbie Vargas, female   DOB: [birth year], 59 y.o.   MRN: [id number] Patient ID: Debbie Vargas, female   DOB: 09-11-1956, 59 y.o.   MRN: [id number]      Cardiology Office Note  Date:  08/01/2015   ID:  Debbie Vargas, DOB [birth year], MRN [id number]  PCP:  Osborne Casco, MD  Cardiologist: Dorothy Spark, MD   Chief complain: Palpitations   History of Present Illness: Debbie Vargas is a 59 y.o. 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 The patient doesn't know what chemo she git and there are no records. She has developed cellulitis on the left implant. In the last months she was treated with 3 different antibiotics, the last was Bactrim. During the treatment she developed palpitations that would last for hours and would be associated with dizziness but no syncope. No SOB, or CP.  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 The patient visited her PCP and ECG was acquired while she was having palpitations, it showed sinus tachycardia with ventricular rate of 130 BPM. She was also hypertensive at the time.  Toprol XL 50 mg daily was initiated. She feels some improvement of symptoms but they are persistent. She denies FH of premature CAD or CHF. Denies LE edema, orthopnea or PND.  Past Medical History  Diagnosis Date  . Breast cancer, female ([place]) 08/13/2011    Past Surgical History  Procedure Laterality Date  . Mastectomy      left  . Cesarean section    . Breast surgery Left 1998    mastectomy  . Breast reconstruction  2000    after left mastectomy  . Removal of tissue expander and placement of implant Left 04/18/2015    Procedure: REMOVE AND REPLACE RUPTURED LEFT BREAST IMPLANTS WITH PARTIAL CAPSULECTOMY;  Surgeon: Youlanda Roys, MD;  Location: [facility];  Service: Plastics;  Laterality: Left;     Current Outpatient  Prescriptions  Medication Sig Dispense Refill  . aspirin 81 MG tablet Take 81 mg by mouth daily.      . Biotin 10 MG TABS Take by mouth every morning.    . Calcium-Vitamin D (CALTRATE 600 PLUS-VIT D PO) Take 1,500 mg by mouth daily.     . metoprolol succinate (TOPROL-XL) 50 MG 24 hr tablet Take 50 mg by mouth daily.  0  . Multiple Vitamin (MULTIVITAMIN) capsule Take 1 capsule by mouth daily.       No current facility-administered medications for this visit.    Allergies:   Review of patient's allergies indicates no known allergies.   Social History:  The patient  reports that she has never smoked. She has never used smokeless tobacco. She reports that she drinks alcohol. She reports that she does not use illicit drugs.   Family History:  The patient's family history is not on file.   ROS:  Please see the history of present illness.    All other systems are reviewed and negative.   PHYSICAL EXAM: VS:  BP 120/62 mmHg  Pulse 78  Ht 5\' 1"  (1.549 m)  Wt 129 lb (58.514 kg)  BMI 24.39 kg/m2 , BMI Body mass index is 24.39 kg/(m^2). GEN: Well nourished, well developed, in no acute distress HEENT: normal Neck: no JVD, carotid bruits, or masses Cardiac: RRR; no murmurs, rubs, or gallops,no edema  Respiratory:  clear to auscultation bilaterally, normal work of breathing GI: soft, nontender, nondistended, + BS MS: no deformity or atrophy Skin: warm and dry, no rash Neuro:  Strength and sensation are intact Psych: euthymic mood, full affect  Recent Labs: 06/29/2015: ALT(SGPT) 24; BUN, Bld 10; Creat 1.0; HGB 14.8; Platelets 303; Potassium 4.4; Sodium 138   Lipid Panel No results found for: CHOL, TRIG, HDL, CHOLHDL, VLDL, LDLCALC, LDLDIRECT  Wt Readings from Last 3 Encounters:  08/01/15 129 lb (58.514 kg)  07/13/15 126 lb (57.153 kg)  07/02/15 130 lb (58.968 kg)    ECG: SR, normal ECG   ASSESSMENT AND PLAN:  1.  Palpitations - ECG shows sinus tachycardia, agree with Toprol XL, a 24  hour Holter monitor was completely normal. Its possible that this is a reaction to antibiotics. Her labs including TSH were normal.  2. S/p chemotherapy for breast ca 20 years ago, possibly anthracyclines, an echocardiogram showed normal LVEF  Follow up in 1 year.  Signed, Dorothy Spark, MD  08/01/2015 4:10 PM    [facility] [place], [place], [place]  [address] Phone: [phone]; Fax: [phone]

## 2015-08-01 NOTE — Patient Instructions (Addendum)
Your physician recommends that you continue on your current medications as directed. Please refer to the Current Medication list given to you today.         Your physician wants you to follow-up in: [place]. You will receive a reminder letter in the mail two months in advance. If you don't receive a letter, please call our office to schedule the follow-up appointment.

## 2016-03-03 ENCOUNTER — Other Ambulatory Visit: Payer: Self-pay

## 2016-03-03 DIAGNOSIS — Z1231 Encounter for screening mammogram for malignant neoplasm of breast: Secondary | ICD-10-CM

## 2016-03-13 ENCOUNTER — Ambulatory Visit
Admission: RE | Admit: 2016-03-13 | Discharge: 2016-03-13 | Disposition: A | Discharge status: Home / Self Care | Payer: BC Managed Care – PPO | Source: Ambulatory Visit

## 2016-03-13 DIAGNOSIS — Z1231 Encounter for screening mammogram for malignant neoplasm of breast: Secondary | ICD-10-CM

## 2016-07-01 ENCOUNTER — Encounter: Payer: Self-pay | Admitting: Family

## 2016-07-01 ENCOUNTER — Other Ambulatory Visit (HOSPITAL_BASED_OUTPATIENT_CLINIC_OR_DEPARTMENT_OTHER): Payer: BC Managed Care – PPO

## 2016-07-01 ENCOUNTER — Ambulatory Visit (HOSPITAL_BASED_OUTPATIENT_CLINIC_OR_DEPARTMENT_OTHER): Payer: BC Managed Care – PPO | Admitting: Family

## 2016-07-01 VITALS — BP 105/63 | HR 71 | Temp 97.5°F | Resp 16 | Ht 61.0 in | Wt 137.0 lb

## 2016-07-01 DIAGNOSIS — C50419 Malignant neoplasm of upper-outer quadrant of unspecified female breast: Secondary | ICD-10-CM

## 2016-07-01 DIAGNOSIS — Z853 Personal history of malignant neoplasm of breast: Secondary | ICD-10-CM

## 2016-07-01 DIAGNOSIS — C50012 Malignant neoplasm of nipple and areola, left female breast: Secondary | ICD-10-CM

## 2016-07-01 LAB — CBC WITH DIFFERENTIAL (CANCER CENTER ONLY)
BASO#: 0 10*3/uL (ref 0.0–0.2)
BASO%: 0.8 % (ref 0.0–2.0)
EOS ABS: 0.2 10*3/uL (ref 0.0–0.5)
EOS%: 4.7 % (ref 0.0–7.0)
HEMATOCRIT: 43.9 % (ref 34.8–46.6)
HEMOGLOBIN: 14.6 g/dL (ref 11.6–15.9)
LYMPH#: 1.3 10*3/uL (ref 0.9–3.3)
LYMPH%: 26.5 % (ref 14.0–48.0)
MCH: 29.4 pg (ref 26.0–34.0)
MCHC: 33.3 g/dL (ref 32.0–36.0)
MCV: 89 fL (ref 81–101)
MONO#: 0.5 10*3/uL (ref 0.1–0.9)
MONO%: 9.3 % (ref 0.0–13.0)
NEUT#: 2.9 10*3/uL (ref 1.5–6.5)
NEUT%: 58.7 % (ref 39.6–80.0)
Platelets: 240 10*3/uL (ref 145–400)
RBC: 4.96 10*6/uL (ref 3.70–5.32)
RDW: 12.7 % (ref 11.1–15.7)
WBC: 4.9 10*3/uL (ref 3.9–10.0)

## 2016-07-01 LAB — COMPREHENSIVE METABOLIC PANEL
ALBUMIN: 4 g/dL (ref 3.5–5.0)
ALK PHOS: 72 U/L (ref 40–150)
ALT: 22 U/L (ref 0–55)
ANION GAP: 10 meq/L (ref 3–11)
AST: 25 U/L (ref 5–34)
BUN: 12 mg/dL (ref 7.0–26.0)
CALCIUM: 10 mg/dL (ref 8.4–10.4)
CO2: 27 mEq/L (ref 22–29)
Chloride: 105 mEq/L (ref 98–109)
Creatinine: 0.8 mg/dL (ref 0.6–1.1)
EGFR: 80 mL/min/{1.73_m2} — AB (ref >90)
Glucose: 96 mg/dl (ref 70–140)
POTASSIUM: 4.7 meq/L (ref 3.5–5.1)
Sodium: 143 mEq/L (ref 136–145)
Total Bilirubin: 0.74 mg/dL (ref 0.20–1.20)
Total Protein: 7.3 g/dL (ref 6.4–8.3)

## 2016-07-01 NOTE — Progress Notes (Signed)
Hematology and Oncology Follow Up Visit  NATHASHA MCGILLIVRAY [id number] Nov 16, 1955 60 y.o. 07/01/2016   Principle Diagnosis:  History of locally recurrent ductal carcinoma of the left breast   Current Therapy:   Observation     Interim History:  Ms. Diallo is here today for her annual follow-up. She is doing quite well. She was seen by ID at [facility] and the cellulitis of her left breast has resolved without her losing the implant. Her mammogram in June was negative. She plans to have a 3D mammogram next year.  Her breast exam today was negative. No lymphadenopathy found on exam.  No fever, chills, n/v, cough, rash, dizziness, SOB, chest pain, palpitations, abdominal pain or changes in bowel or bladder habits.  No swelling, tenderness, numbness or tingling in her extremities.  No episodes of bleeding or petechiae. She takes 1 baby aspirin daily and has noticed that she does bruise easily at times.  She has maintained a good appetite and is staying well hydrated. Her weight is stable.  She has now moved to [place] to be closer to both of her daughters and new granddaughter. We have had the privilege of caring for her now for almost 20 years. She would like a referral to an oncologist closer to her new home. Dr. Marin Olp has recommended Dr. Vallery Sa with [facility]. She was given the office contact information and we will be happy to send her records there when she is ready.   Medications:    Medication List       Accurate as of 07/01/16 11:32 AM. Always use your most recent med list.          aspirin 81 MG tablet Take 81 mg by mouth daily.   Biotin 10 MG Tabs Take by mouth every morning.   CALTRATE 600 PLUS-VIT D PO Take 1,500 mg by mouth daily.   metoprolol succinate 50 MG 24 hr tablet Commonly known as:  TOPROL-XL Take 1 tablet (50 mg total) by mouth daily.   multivitamin capsule Take 1 capsule by mouth daily.   VITAMIN D PO Take by mouth.       Allergies: No Known  Allergies  Past Medical History, Surgical history, Social history, and Family History were reviewed and updated.  Review of Systems: All other 10 point review of systems is negative.   Physical Exam:  height is 5\' 1"  (1.549 m) and weight is 137 lb (62.1 kg). Her oral temperature is 97.5 F (36.4 C). Her blood pressure is 105/63 and her pulse is 71. Her respiration is 16.   Wt Readings from Last 3 Encounters:  07/01/16 137 lb (62.1 kg)  08/01/15 129 lb (58.5 kg)  07/13/15 126 lb (57.2 kg)    Ocular: Sclerae unicteric, pupils equal, round and reactive to light Ear-nose-throat: Oropharynx clear, dentition fair Lymphatic: No cervical supraclavicular or axillary adenopathy Lungs no rales or rhonchi, good excursion bilaterally Heart regular rate and rhythm, no murmur appreciated Abd soft, nontender, positive bowel sounds, no liver or spleen tip palpated on exam  MSK no focal spinal tenderness, no joint edema Neuro: non-focal, well-oriented, appropriate affect Breasts: No changes with the right breast and left breast implant intact with no changes. No mass, lesion rash, cellulitis or lymphadenopathy found on exam.  Lab Results  Component Value Date   WBC 4.9 07/01/2016   HGB 14.6 07/01/2016   HCT 43.9 07/01/2016   MCV 89 07/01/2016   PLT 240 07/01/2016   No results found for: FERRITIN,  IRON, TIBC, UIBC, IRONPCTSAT Lab Results  Component Value Date   RBC 4.96 07/01/2016   No results found for: KPAFRELGTCHN, LAMBDASER, KAPLAMBRATIO No results found for: IGGSERUM, IGA, IGMSERUM No results found for: Odetta Pink, SPEI   Chemistry      Component Value Date/Time   NA 138 06/29/2015 0845   K 4.4 06/29/2015 0845   CL 100 06/29/2015 0845   CO2 29 06/29/2015 0845   BUN 10 06/29/2015 0845   CREATININE 1.0 06/29/2015 0845      Component Value Date/Time   CALCIUM 9.6 06/29/2015 0845   ALKPHOS 56 06/29/2015 0845   AST 32  06/29/2015 0845   ALT 24 06/29/2015 0845   BILITOT 0.70 06/29/2015 0845     Impression and Plan: Ms. Hoffpauir is 60 yo white female with history of locally recurrent ductal carcinoma of the left breast out 18 years now from diagnosis. She continues to do well and so far has shown no evidence of recurrence. Her exam today was negative. CBC looks great.  As mentioned above she has now moved to [place], [place] to be closer to her sweet daughters and granddaughter. She plans to follow-up with Dr. Vallery Sa at [facility]. She has their contact information and will notify us when they are ready for her records. We are happy to help her with this and would be happy to see her again if she ever moved back this way.  She will contact us with any questions or concerns. We can certainly see her sooner if need be.   Eliezer Bottom, NP 10/10/201711:32 AM

## 2016-07-02 LAB — VITAMIN D 25 HYDROXY (VIT D DEFICIENCY, FRACTURES): Vitamin D, 25-Hydroxy: 44.5 ng/mL (ref 30.0–100.0)

## 2016-07-23 ENCOUNTER — Other Ambulatory Visit: Payer: Self-pay | Admitting: Cardiology

## 2016-07-23 DIAGNOSIS — R002 Palpitations: Secondary | ICD-10-CM

## 2016-09-29 ENCOUNTER — Ambulatory Visit (INDEPENDENT_AMBULATORY_CARE_PROVIDER_SITE_OTHER): Payer: BC Managed Care – PPO | Admitting: Cardiology

## 2016-09-29 VITALS — BP 122/60 | HR 77 | Ht 61.0 in | Wt 137.0 lb

## 2016-09-29 DIAGNOSIS — T451X5S Adverse effect of antineoplastic and immunosuppressive drugs, sequela: Secondary | ICD-10-CM | POA: Diagnosis not present

## 2016-09-29 DIAGNOSIS — R002 Palpitations: Secondary | ICD-10-CM | POA: Diagnosis not present

## 2016-09-29 MED ORDER — METOPROLOL SUCCINATE ER 25 MG PO TB24: 25.0000 mg | ORAL_TABLET | Freq: Every day | ORAL | 3 refills | Status: DC

## 2016-09-29 NOTE — Patient Instructions (Signed)
Medication Instructions:   DECREASE YOUR METOPROLOL SUCCINATE (TOPROL XL) TO 25 MG ONCE DAILY    Follow-Up:  AS NEEDED WITH DR Meda Coffee       If you need a refill on your cardiac medications before your next appointment, please call your pharmacy.

## 2016-09-29 NOTE — Progress Notes (Signed)
Patient ID: Debbie Vargas, female   DOB: 03/30/1956, 61 y.o.   MRN: [id number]      Cardiology Office Note  Date:  09/29/2016   ID:  Debbie, Vargas 17-May-1956, MRN [id number]  PCP:  Osborne Casco, MD  Cardiologist: Ena Dawley, MD   Chief complain: Palpitations   History of Present Illness: Debbie Vargas is a 61 y.o. 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 The patient doesn't know what chemo she git and there are no records. She has developed cellulitis on the left implant. In the last months she was treated with 3 different antibiotics, the last was Bactrim. During the treatment she developed palpitations that would last for hours and would be associated with dizziness but no syncope. No SOB, or CP.  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 The patient visited her PCP and ECG was acquired while she was having palpitations, it showed sinus tachycardia with ventricular rate of 130 BPM. She was also hypertensive at the time.  Toprol XL 50 mg daily was initiated. She feels some improvement of symptoms but they are persistent. She denies FH of premature CAD or CHF. Denies LE edema, orthopnea or PND.  09/29/2016 this is one year follow-up, patient has had no symptoms whatsoever. She continues to take Toprol-XL 50 mg daily. She denies any palpitations no dizziness presyncope or syncope. On one occasion when she was at the oncology office her blood pressure was running low but she was asymptomatic. If any lower extremity edema orthopnea or paroxysmal nocturnal dyspnea.    Past Medical History:  Diagnosis Date  . Breast cancer, female ([facility]) 08/13/2011    Past Surgical History:  Procedure Laterality Date  . BREAST RECONSTRUCTION  2000   after left mastectomy  . BREAST SURGERY Left 1998   mastectomy  . CESAREAN SECTION    . MASTECTOMY     left  . REMOVAL  OF TISSUE EXPANDER AND PLACEMENT OF IMPLANT Left 04/18/2015   Procedure: REMOVE AND REPLACE RUPTURED LEFT BREAST IMPLANTS WITH PARTIAL CAPSULECTOMY;  Surgeon: Youlanda Roys, MD;  Location: [facility];  Service: Plastics;  Laterality: Left;     Current Outpatient Prescriptions  Medication Sig Dispense Refill  . aspirin 81 MG tablet Take 81 mg by mouth daily.      . Biotin 10 MG TABS Take by mouth every morning.    . Calcium-Vitamin D (CALTRATE 600 PLUS-VIT D PO) Take 1,500 mg by mouth daily.     . Cholecalciferol (VITAMIN D PO) Take by mouth.    . metoprolol succinate (TOPROL-XL) 50 MG 24 hr tablet TAKE 1 TABLET (50 MG TOTAL) BY MOUTH DAILY. 90 tablet 0  . Multiple Vitamin (MULTIVITAMIN) capsule Take 1 capsule by mouth daily.       No current facility-administered medications for this visit.     Allergies:   Patient has no known allergies.   Social History:  The patient  reports that she has never smoked. She has never used smokeless tobacco. She reports that she drinks alcohol. She reports that she does not use drugs.   Family History:  The patient's family history is not on file.   ROS:  Please see the history of present illness.    All other systems are reviewed and negative.   PHYSICAL EXAM: VS:  BP 122/60   Pulse 77  Ht 5\' 1"  (1.549 m)   Wt 137 lb (62.1 kg)   BMI 25.89 kg/m  , BMI Body mass index is 25.89 kg/m. GEN: Well nourished, well developed, in no acute distress  HEENT: normal  Neck: no JVD, carotid bruits, or masses Cardiac: RRR; no murmurs, rubs, or gallops,no edema  Respiratory:  clear to auscultation bilaterally, normal work of breathing GI: soft, nontender, nondistended, + BS MS: no deformity or atrophy  Skin: warm and dry, no rash Neuro:  Strength and sensation are intact Psych: euthymic mood, full affect  Recent Labs: 07/01/2016: ALT 22; BUN 12.0; Creatinine 0.8; HGB 14.6; Platelets 240; Potassium 4.7; Sodium 143   Lipid Panel No  results found for: CHOL, TRIG, HDL, CHOLHDL, VLDL, LDLCALC, LDLDIRECT  Wt Readings from Last 3 Encounters:  09/29/16 137 lb (62.1 kg)  07/01/16 137 lb (62.1 kg)  08/01/15 129 lb (58.5 kg)    ECG: SR, normal ECG   ASSESSMENT AND PLAN:  1.  Palpitations - resolved, normal TSH, most probably reaction to antibiotics. She is moving to [place]. She is advised to find a new PCP,  and cardiologist only as PRN. We will fax all the records. Echocardiogram showed normal LVEF and no significant valvular abnormalities. We will decrease Toprol XL to 25 mg po daily.   2. S/p chemotherapy for breast ca 20 years ago, possibly anthracyclines, an echocardiogram showed normal LVEF  Signed, Ena Dawley, MD  09/29/2016 1:30 PM    [facility] [place], [place], [place]  [address] Phone: [phone]; Fax: [phone]

## 2017-09-01 ENCOUNTER — Other Ambulatory Visit: Payer: Self-pay | Admitting: Cardiology

## 2017-09-01 DIAGNOSIS — R002 Palpitations: Secondary | ICD-10-CM

## 2017-09-03 ENCOUNTER — Other Ambulatory Visit: Payer: Self-pay | Admitting: Cardiology

## 2017-09-03 DIAGNOSIS — R002 Palpitations: Secondary | ICD-10-CM

## 2017-09-28 ENCOUNTER — Other Ambulatory Visit: Payer: Self-pay | Admitting: Cardiology

## 2017-09-28 DIAGNOSIS — R002 Palpitations: Secondary | ICD-10-CM

## 2017-09-28 MED ORDER — METOPROLOL SUCCINATE ER 25 MG PO TB24: 25.0000 mg | ORAL_TABLET | Freq: Every day | ORAL | 0 refills | Status: AC

## 2017-10-02 ENCOUNTER — Other Ambulatory Visit: Payer: Self-pay | Admitting: Cardiology

## 2017-10-02 DIAGNOSIS — R002 Palpitations: Secondary | ICD-10-CM
